# Patient Record
Sex: Female | Born: 1950 | Race: White | Hispanic: No | Marital: Married | Smoking: Never smoker
Health system: Southern US, Community
[De-identification: ages and names within clinical notes are randomized; demographics above are authoritative.]

## PROBLEM LIST (undated history)

## (undated) DIAGNOSIS — I10 Essential (primary) hypertension: Secondary | ICD-10-CM

## (undated) DIAGNOSIS — I7782 Antineutrophilic cytoplasmic antibody (ANCA) vasculitis: Secondary | ICD-10-CM

## (undated) DIAGNOSIS — Z8619 Personal history of other infectious and parasitic diseases: Secondary | ICD-10-CM

## (undated) DIAGNOSIS — I776 Arteritis, unspecified: Secondary | ICD-10-CM

## (undated) DIAGNOSIS — N250 Renal osteodystrophy: Secondary | ICD-10-CM

## (undated) DIAGNOSIS — N6002 Solitary cyst of left breast: Secondary | ICD-10-CM

## (undated) DIAGNOSIS — N189 Chronic kidney disease, unspecified: Secondary | ICD-10-CM

## (undated) DIAGNOSIS — E663 Overweight: Secondary | ICD-10-CM

## (undated) DIAGNOSIS — M85839 Other specified disorders of bone density and structure, unspecified forearm: Secondary | ICD-10-CM

## (undated) DIAGNOSIS — Z5189 Encounter for other specified aftercare: Secondary | ICD-10-CM

## (undated) DIAGNOSIS — D631 Anemia in chronic kidney disease: Secondary | ICD-10-CM

## (undated) DIAGNOSIS — E782 Mixed hyperlipidemia: Secondary | ICD-10-CM

## (undated) HISTORY — DX: Essential (primary) hypertension: I10

## (undated) HISTORY — PX: COLONOSCOPY: SHX174

## (undated) HISTORY — DX: Encounter for other specified aftercare: Z51.89

## (undated) HISTORY — DX: Anemia in chronic kidney disease: D63.1

## (undated) HISTORY — DX: Personal history of other infectious and parasitic diseases: Z86.19

## (undated) HISTORY — DX: Renal osteodystrophy: N25.0

## (undated) HISTORY — DX: Other specified disorders of bone density and structure, unspecified forearm: M85.839

## (undated) HISTORY — DX: Chronic kidney disease, unspecified: N18.9

## (undated) HISTORY — DX: Arteritis, unspecified: I77.6

## (undated) HISTORY — DX: Mixed hyperlipidemia: E78.2

## (undated) HISTORY — DX: Antineutrophilic cytoplasmic antibody (ANCA) vasculitis: I77.82

## (undated) HISTORY — DX: Overweight: E66.3

## (undated) HISTORY — PX: ABDOMINAL HYSTERECTOMY: SHX81

---

## 2014-06-06 DIAGNOSIS — O99119 Other diseases of the blood and blood-forming organs and certain disorders involving the immune mechanism complicating pregnancy, unspecified trimester: Secondary | ICD-10-CM | POA: Insufficient documentation

## 2014-06-06 DIAGNOSIS — N19 Unspecified kidney failure: Secondary | ICD-10-CM | POA: Insufficient documentation

## 2014-06-22 HISTORY — PX: RENAL BIOPSY: SHX156

## 2014-06-23 DIAGNOSIS — I776 Arteritis, unspecified: Secondary | ICD-10-CM | POA: Insufficient documentation

## 2014-06-23 DIAGNOSIS — N179 Acute kidney failure, unspecified: Secondary | ICD-10-CM | POA: Insufficient documentation

## 2017-01-24 ENCOUNTER — Ambulatory Visit: Payer: Self-pay | Admitting: Physician Assistant

## 2017-01-31 ENCOUNTER — Ambulatory Visit: Payer: Self-pay | Admitting: Physician Assistant

## 2017-04-05 ENCOUNTER — Encounter: Payer: Self-pay | Admitting: Physician Assistant

## 2017-04-05 ENCOUNTER — Ambulatory Visit (INDEPENDENT_AMBULATORY_CARE_PROVIDER_SITE_OTHER): Payer: BLUE CROSS/BLUE SHIELD | Admitting: Physician Assistant

## 2017-04-05 VITALS — BP 134/88 | HR 75 | Resp 14 | Ht 67.0 in | Wt 185.0 lb

## 2017-04-05 DIAGNOSIS — I776 Arteritis, unspecified: Secondary | ICD-10-CM

## 2017-04-05 DIAGNOSIS — I129 Hypertensive chronic kidney disease with stage 1 through stage 4 chronic kidney disease, or unspecified chronic kidney disease: Secondary | ICD-10-CM | POA: Diagnosis not present

## 2017-04-05 DIAGNOSIS — Z1231 Encounter for screening mammogram for malignant neoplasm of breast: Secondary | ICD-10-CM

## 2017-04-05 DIAGNOSIS — Z13220 Encounter for screening for lipoid disorders: Secondary | ICD-10-CM

## 2017-04-05 DIAGNOSIS — E663 Overweight: Secondary | ICD-10-CM

## 2017-04-05 DIAGNOSIS — N183 Chronic kidney disease, stage 3 unspecified: Secondary | ICD-10-CM | POA: Insufficient documentation

## 2017-04-05 DIAGNOSIS — Z1159 Encounter for screening for other viral diseases: Secondary | ICD-10-CM

## 2017-04-05 DIAGNOSIS — Z23 Encounter for immunization: Secondary | ICD-10-CM | POA: Diagnosis not present

## 2017-04-05 DIAGNOSIS — Z13820 Encounter for screening for osteoporosis: Secondary | ICD-10-CM

## 2017-04-05 DIAGNOSIS — N250 Renal osteodystrophy: Secondary | ICD-10-CM | POA: Diagnosis not present

## 2017-04-05 DIAGNOSIS — Z90710 Acquired absence of both cervix and uterus: Secondary | ICD-10-CM | POA: Insufficient documentation

## 2017-04-05 DIAGNOSIS — D631 Anemia in chronic kidney disease: Secondary | ICD-10-CM | POA: Diagnosis not present

## 2017-04-05 DIAGNOSIS — Z7689 Persons encountering health services in other specified circumstances: Secondary | ICD-10-CM

## 2017-04-05 DIAGNOSIS — I7782 Antineutrophilic cytoplasmic antibody (ANCA) vasculitis: Secondary | ICD-10-CM | POA: Insufficient documentation

## 2017-04-05 HISTORY — DX: Overweight: E66.3

## 2017-04-05 NOTE — Progress Notes (Signed)
HPI:                                                                Gloria Villa is a 67 y.o. 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 States she was on a transplant list, but has never had dialysis. Her renal function gradually recovered and she has been stable with CKD stage 3 and is no longer on a transplant list. She continues to follow with Dr. Danise Mina in [place], [place].  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 Compliant with medications. Checks BP's at home. BP range 94-113/68-75. Denies vision change, headache, chest pain with exertion, orthopnea, lightheadedness, syncope and edema.    Depression screen [facility] 2/9 04/05/2017  Decreased Interest 0  Down, Depressed, Hopeless 0  PHQ - 2 Score 0    No flowsheet data found.    Past Medical History:  Diagnosis Date  . ANCA-positive vasculitis (HCC)    MPO antibody  . Anemia due to chronic kidney disease   . Chronic kidney disease   . History of shingles    age 48's  . Hypertension   . Renal osteodystrophy    Calcitriol 0.5 mcg QOD   Past Surgical History:  Procedure Laterality Date  . ABDOMINAL HYSTERECTOMY     partial, fibroids  . RENAL BIOPSY  06/2014   Social History   Tobacco Use  . Smoking status: Never Smoker  . Smokeless tobacco: Never Used  Substance Use Topics  . Alcohol use: Yes    Alcohol/week: 0.6 oz    Types: 1 Standard drinks or equivalent per week    Frequency: Never   family history includes Diabetes in her brother and mother; Heart attack in her father; Hypertension in her brother and mother; Liver cancer in her mother; Stroke in her brother.    ROS: negative except as noted in the HPI  Medications: Current Outpatient Medications  Medication Sig Dispense Refill  . calcitRIOL (ROCALTROL) 0.5 MCG capsule Take 1 capsule by mouth  daily.    Marland Kitchen losartan (COZAAR) 25 MG tablet Take 12.5 mg by mouth daily.     No current facility-administered medications for this visit.    Allergies  Allergen Reactions  . Sulfa Antibiotics Rash       Objective:  BP 134/88   Pulse 75   Resp 14   Wt 185 lb (83.9 kg)  Gen:  alert, not ill-appearing, no distress, appropriate for age 64: head normocephalic without obvious abnormality, conjunctiva and cornea clear, trachea midline Pulm: Normal work of breathing, normal phonation, clear to auscultation bilaterally, no wheezes, rales or rhonchi CV: Normal rate, regular rhythm, s1 and s2 distinct, no murmurs, clicks or rubs  Neuro: alert and oriented x 3, no tremor MSK: extremities atraumatic, normal gait and station, trace peripheral edema Skin: intact, no rashes on exposed skin, no jaundice, no cyanosis Psych: well-groomed, cooperative, good eye contact, euthymic mood, affect mood-congruent, speech is articulate, and thought processes clear and goal-directed    No results found for this or any previous visit (from the past 72 hour(s)). No results found.    Assessment and Plan: 67 y.o.  female with   1. Encounter to establish care - reviewed PMH, PSH, PFH, medications and allergies - personally reviewed labs and recent office visit notes from [facility]. I agree to collaborate with Dr. Danise Mina in [place], who will continue managing CKD and vasculitis - reviewed health maintenance - due for mammogram and dexa - no longer candidate for Pap smear (hysterectomy benign disease) - colonoscopy UTD per patient, but may be due soon. Info given about Cologuard - immunizations UTD per patient - influenza vaccine high-dose given in office today  2. Breast cancer screening by mammogram - MM SCREENING BREAST TOMO BILATERAL; Future  3. Screening for osteoporosis - DG Bone Density; Future  4. Encounter for hepatitis C screening test for low risk patient - Hepatitis C  antibody  5. Screening for lipid disorders - Lipid Panel w/reflex Direct LDL  6. Chronic kidney disease (CKD), active medical management without dialysis, stage 3 (moderate) (HCC) - last Scr 1.59, BUN 27, GFR 33 12/27/16 - MPO 5.5 12/27/16 - per nephrology, due for repeat labs this month  7. Anemia due to stage 3 chronic kidney disease ([place]) - last Hgb 11.7 10/2016  8. Renal hypertension BP Readings from Last 3 Encounters:  04/05/17 134/88  - patient reports hx of white coat syndrome - home BP's are in range and soft - cont Losartan 12.5 mg  9. ANCA-positive vasculitis ([place])   10. Renal osteodystrophy - cont calcitriol per nephrology  11. Needs flu shot - Flu vaccine HIGH DOSE PF (Fluzone High dose)  Patient education and anticipatory guidance given Patient agrees with treatment plan Follow-up in 6 months or sooner as needed if symptoms worsen or fail to improve  Darlyne Russian PA-C

## 2017-04-05 NOTE — Patient Instructions (Signed)
Repeat labs in March. I have also ordered fasting lipid panel in addition to your Neprhologist's labs. The lab is a walk-in open M-F 7:30a-4:30p (closed 12:30-1:30p). Nothing to eat or drink after midnight or at least 8 hours before your blood draw. You can have water and your medications.     Preventive Care 27 Years and Older, Female Preventive care refers to lifestyle choices and visits with your health care provider that can promote health and wellness. What does preventive care include?  A yearly physical exam. This is also called an annual well check.  Dental exams once or twice a year.  Routine eye exams. Ask your health care provider how often you should have your eyes checked.  Personal lifestyle choices, including: ? Daily care of your teeth and gums. ? Regular physical activity. ? Eating a healthy diet. ? Avoiding tobacco and drug use. ? Limiting alcohol use. ? Practicing safe sex. ? Taking low-dose aspirin every day. ? Taking vitamin and mineral supplements as recommended by your health care provider. What happens during an annual well check? The services and screenings done by your health care provider during your annual well check will depend on your age, overall health, lifestyle risk factors, and family history of disease. Counseling Your health care provider may ask you questions about your:  Alcohol use.  Tobacco use.  Drug use.  Emotional well-being.  Home and relationship well-being.  Sexual activity.  Eating habits.  History of falls.  Memory and ability to understand (cognition).  Work and work Statistician.  Reproductive health.  Screening You may have the following tests or measurements:  Height, weight, and BMI.  Blood pressure.  Lipid and cholesterol levels. These may be checked every 5 years, or more frequently if you are over 51 years old.  Skin check.  Lung cancer screening. You may have this screening every year starting at age  7 if you have a 30-pack-year history of smoking and currently smoke or have quit within the past 15 years.  Fecal occult blood test (FOBT) of the stool. You may have this test every year starting at age 29.  Flexible sigmoidoscopy or colonoscopy. You may have a sigmoidoscopy every 5 years or a colonoscopy every 10 years starting at age 87.  Hepatitis C blood test.  Hepatitis B blood test.  Sexually transmitted disease (STD) testing.  Diabetes screening. This is done by checking your blood sugar (glucose) after you have not eaten for a while (fasting). You may have this done every 1-3 years.  Bone density scan. This is done to screen for osteoporosis. You may have this done starting at age 8.  Mammogram. This may be done every 1-2 years. Talk to your health care provider about how often you should have regular mammograms.  Talk with your health care provider about your test results, treatment options, and if necessary, the need for more tests. Vaccines Your health care provider may recommend certain vaccines, such as:  Influenza vaccine. This is recommended every year.  Tetanus, diphtheria, and acellular pertussis (Tdap, Td) vaccine. You may need a Td booster every 10 years.  Varicella vaccine. You may need this if you have not been vaccinated.  Zoster vaccine. You may need this after age 42.  Measles, mumps, and rubella (MMR) vaccine. You may need at least one dose of MMR if you were born in 1957 or later. You may also need a second dose.  Pneumococcal 13-valent conjugate (PCV13) vaccine. One dose is recommended after  age 82.  Pneumococcal polysaccharide (PPSV23) vaccine. One dose is recommended after age 48.  Meningococcal vaccine. You may need this if you have certain conditions.  Hepatitis A vaccine. You may need this if you have certain conditions or if you travel or work in places where you may be exposed to hepatitis A.  Hepatitis B vaccine. You may need this if you  have certain conditions or if you travel or work in places where you may be exposed to hepatitis B.  Haemophilus influenzae type b (Hib) vaccine. You may need this if you have certain conditions.  Talk to your health care provider about which screenings and vaccines you need and how often you need them. This information is not intended to replace advice given to you by your health care provider. Make sure you discuss any questions you have with your health care provider. Document Released: 03/06/2015 Document Revised: 10/28/2015 Document Reviewed: 12/09/2014 Elsevier Interactive Patient Education  [employer].

## 2017-04-19 ENCOUNTER — Ambulatory Visit: Payer: BLUE CROSS/BLUE SHIELD

## 2017-04-19 ENCOUNTER — Ambulatory Visit (INDEPENDENT_AMBULATORY_CARE_PROVIDER_SITE_OTHER): Payer: BLUE CROSS/BLUE SHIELD

## 2017-04-19 DIAGNOSIS — Z1231 Encounter for screening mammogram for malignant neoplasm of breast: Secondary | ICD-10-CM

## 2017-04-19 DIAGNOSIS — R928 Other abnormal and inconclusive findings on diagnostic imaging of breast: Secondary | ICD-10-CM | POA: Diagnosis not present

## 2017-04-20 ENCOUNTER — Other Ambulatory Visit: Payer: Self-pay | Admitting: Physician Assistant

## 2017-04-20 DIAGNOSIS — R928 Other abnormal and inconclusive findings on diagnostic imaging of breast: Secondary | ICD-10-CM

## 2017-04-24 ENCOUNTER — Encounter: Payer: Self-pay | Admitting: Physician Assistant

## 2017-04-24 ENCOUNTER — Ambulatory Visit
Admission: RE | Admit: 2017-04-24 | Discharge: 2017-04-24 | Disposition: A | Discharge status: Home / Self Care | Payer: BLUE CROSS/BLUE SHIELD | Source: Ambulatory Visit | Attending: Physician Assistant | Admitting: Physician Assistant

## 2017-04-24 ENCOUNTER — Other Ambulatory Visit: Payer: Self-pay | Admitting: Physician Assistant

## 2017-04-24 ENCOUNTER — Ambulatory Visit
Admission: RE | Admit: 2017-04-24 | Discharge: 2017-04-24 | Disposition: A | Discharge status: Home / Self Care | Payer: PRIVATE HEALTH INSURANCE | Source: Ambulatory Visit | Attending: Physician Assistant | Admitting: Physician Assistant

## 2017-04-24 DIAGNOSIS — N6320 Unspecified lump in the left breast, unspecified quadrant: Secondary | ICD-10-CM

## 2017-04-24 DIAGNOSIS — R928 Other abnormal and inconclusive findings on diagnostic imaging of breast: Secondary | ICD-10-CM | POA: Insufficient documentation

## 2017-04-26 ENCOUNTER — Encounter: Payer: Self-pay | Admitting: Physician Assistant

## 2017-04-26 ENCOUNTER — Ambulatory Visit (INDEPENDENT_AMBULATORY_CARE_PROVIDER_SITE_OTHER): Payer: BLUE CROSS/BLUE SHIELD

## 2017-04-26 DIAGNOSIS — M85832 Other specified disorders of bone density and structure, left forearm: Secondary | ICD-10-CM | POA: Diagnosis not present

## 2017-04-26 DIAGNOSIS — M85839 Other specified disorders of bone density and structure, unspecified forearm: Secondary | ICD-10-CM

## 2017-04-26 HISTORY — DX: Other specified disorders of bone density and structure, unspecified forearm: M85.839

## 2017-04-26 NOTE — Progress Notes (Signed)
Bone density shows osteopenia, which is decreased bone mass Recommend she continue her calcitriol and add vitamin D 1000 units daily Also recommend regular weight bearing exercise to prevent osteoporosis Repeat bone density in 2 years

## 2017-04-27 ENCOUNTER — Encounter: Payer: Self-pay | Admitting: Physician Assistant

## 2017-04-27 DIAGNOSIS — E782 Mixed hyperlipidemia: Secondary | ICD-10-CM

## 2017-04-27 HISTORY — DX: Mixed hyperlipidemia: E78.2

## 2017-04-27 LAB — LIPID PANEL W/REFLEX DIRECT LDL
CHOLESTEROL: 245 mg/dL — AB (ref <200)
HDL: 54 mg/dL (ref >50)
LDL CHOLESTEROL (CALC): 161 mg/dL — AB
Non-HDL Cholesterol (Calc): 191 mg/dL (calc) — ABNORMAL HIGH (ref <130)
Total CHOL/HDL Ratio: 4.5 (calc) (ref <5.0)
Triglycerides: 155 mg/dL — ABNORMAL HIGH (ref <150)

## 2017-04-27 LAB — HEPATITIS C ANTIBODY
Hepatitis C Ab: NONREACTIVE
SIGNAL TO CUT-OFF: 0.03 (ref <1.00)

## 2017-04-27 NOTE — Progress Notes (Signed)
Total and LDL cholesterol are high LDL is 161 and I would like her below 100 I would recommend cholesterol lowering medication in addition to low-fat diet and aerobic exericse

## 2017-10-30 ENCOUNTER — Encounter: Payer: Self-pay | Admitting: Physician Assistant

## 2017-10-30 ENCOUNTER — Ambulatory Visit
Admission: RE | Admit: 2017-10-30 | Discharge: 2017-10-30 | Disposition: A | Discharge status: Home / Self Care | Payer: PRIVATE HEALTH INSURANCE | Source: Ambulatory Visit | Attending: Physician Assistant | Admitting: Physician Assistant

## 2017-10-30 ENCOUNTER — Ambulatory Visit
Admission: RE | Admit: 2017-10-30 | Discharge: 2017-10-30 | Disposition: A | Discharge status: Home / Self Care | Payer: BLUE CROSS/BLUE SHIELD | Source: Ambulatory Visit | Attending: Physician Assistant | Admitting: Physician Assistant

## 2017-10-30 ENCOUNTER — Other Ambulatory Visit: Payer: Self-pay | Admitting: Physician Assistant

## 2017-10-30 DIAGNOSIS — N6002 Solitary cyst of left breast: Secondary | ICD-10-CM | POA: Insufficient documentation

## 2017-10-30 DIAGNOSIS — N6320 Unspecified lump in the left breast, unspecified quadrant: Secondary | ICD-10-CM

## 2017-10-30 HISTORY — DX: Solitary cyst of left breast: N60.02

## 2018-05-01 ENCOUNTER — Ambulatory Visit
Admission: RE | Admit: 2018-05-01 | Discharge: 2018-05-01 | Disposition: A | Discharge status: Home / Self Care | Payer: BLUE CROSS/BLUE SHIELD | Source: Ambulatory Visit | Attending: Physician Assistant | Admitting: Physician Assistant

## 2018-05-01 DIAGNOSIS — N6320 Unspecified lump in the left breast, unspecified quadrant: Secondary | ICD-10-CM

## 2019-01-01 ENCOUNTER — Emergency Department
Admission: EM | Admit: 2019-01-01 | Discharge: 2019-01-01 | Disposition: A | Discharge status: Home / Self Care | Payer: BLUE CROSS/BLUE SHIELD | Source: Home / Self Care | Attending: Emergency Medicine | Admitting: Emergency Medicine

## 2019-01-01 ENCOUNTER — Other Ambulatory Visit: Payer: Self-pay

## 2019-01-01 DIAGNOSIS — R05 Cough: Secondary | ICD-10-CM | POA: Diagnosis not present

## 2019-01-01 DIAGNOSIS — U071 COVID-19: Secondary | ICD-10-CM

## 2019-01-01 DIAGNOSIS — R519 Headache, unspecified: Secondary | ICD-10-CM

## 2019-01-01 DIAGNOSIS — Z20828 Contact with and (suspected) exposure to other viral communicable diseases: Secondary | ICD-10-CM

## 2019-01-01 DIAGNOSIS — R059 Cough, unspecified: Secondary | ICD-10-CM

## 2019-01-01 DIAGNOSIS — Z20822 Contact with and (suspected) exposure to covid-19: Secondary | ICD-10-CM

## 2019-01-01 LAB — POC SARS CORONAVIRUS 2 AG -  ED: SARS Coronavirus 2 Ag: POSITIVE

## 2019-01-01 NOTE — ED Triage Notes (Signed)
Pt has had a low grade headache and cough since Wednesday or Thursday last week.  Husband tested positive for COVID on Nov 5.

## 2019-01-01 NOTE — Discharge Instructions (Addendum)
Your test is positive for COVID-19.  You have signs and symptoms of COVID-19, but they are mild.  Please read attached instruction sheets on Covid.  Overall, treatment is rest, push fluids, may use Tylenol for pain or fever.  With your history of kidney vasculitis in the past, avoid all NSAIDs.  If any severe worsening symptoms, go to emergency room.  You need to establish with a local PCP. You need to quarantine, as described in instruction sheets.  You would need to wait at least 10 days after fever is gone and her symptoms are better, before ending quarantine.

## 2019-01-02 NOTE — ED Provider Notes (Signed)
[facility]    CSN: [id number] Arrival date & time: 01/01/19  1242      History   Chief Complaint Chief Complaint  Patient presents with   Cough   Headache    HPI Gloria Villa is a 68 y.o. female.    Cough Associated symptoms: headaches   Headache Associated symptoms: cough    Low-grade headache and mild cough, nonproductive.  Onset 5 days ago.  Minimal fatigue, but otherwise feeling well.  Husband tested positive for Covid November 5.Not treating with any medicine.  She has been drinking plenty of fluids.  Appetite okay.  No nausea or vomiting or diarrhea.  No chest pain or shortness of breath.  Slightly decreased sense of smell.  No focal neurologic symptoms.  She has a history of chronic kidney disease that has been stable and she follows up with her specialist regularly.  Urine output normal for her these days.   Review of Systems  +  mild Cough No  shortness of breath No  respiratory distress  + mild fatigue + minimal nasal congestion No mild sore throat No mild swollen anterior neck glands No  vomiting No  diarrhea No  acute vision changes No  stiff neck No  focal weakness No  syncope No  seizures No  GU symptoms No  new rash   Pertinent items noted in HPI and remainder of comprehensive ROS otherwise negative.  Past Medical History:  Diagnosis Date   ANCA-positive vasculitis (HCC)    MPO antibody   Anemia due to chronic kidney disease    Chronic kidney disease    Cyst of left breast 10/30/2017   stable cyst with internal septation in the left breast at 10 o'clock 5 cm from the nipple measuring 6 x 2 x 8 mm   History of shingles    age 34's   Hypertension    Mixed hyperlipidemia 04/27/2017   Osteopenia of forearm 04/26/2017   Overweight with body mass index (BMI) 25.0-29.9 04/05/2017   Renal osteodystrophy    Calcitriol 0.5 mcg QOD    Patient Active Problem List   Diagnosis Date Noted   Cyst of left breast 10/30/2017    Mixed hyperlipidemia 04/27/2017   Osteopenia of forearm 04/26/2017   Abnormal mammogram of left breast 04/24/2017   Chronic kidney disease (CKD), active medical management without dialysis, stage 3 (moderate) 04/05/2017   Anemia due to stage 3 chronic kidney disease 04/05/2017   Renal hypertension 04/05/2017   ANCA-positive vasculitis ([place]) 04/05/2017   H/O hysterectomy for benign disease 04/05/2017   Overweight with body mass index (BMI) 25.0-29.9 04/05/2017   Renal osteodystrophy     Past Surgical History:  Procedure Laterality Date   ABDOMINAL HYSTERECTOMY     partial, fibroids   RENAL BIOPSY  06/2014    OB History    Gravida  2   Para  2   Term      Preterm      AB      Living  2     SAB      TAB      Ectopic      Multiple      Live Births               Home Medications    Prior to Admission medications   Medication Sig Start Date End Date Taking? Authorizing Provider  calcitRIOL (ROCALTROL) 0.5 MCG capsule Take 1 capsule by mouth daily. 01/17/17   [provider]  losartan (COZAAR) 25 MG tablet Take 12.5 mg by mouth daily. 01/16/17   [provider]    Family History Family History  Problem Relation Age of Onset   Liver cancer Mother    Diabetes Mother    Hypertension Mother    Heart attack Father    Diabetes Brother    Hypertension Brother    Stroke Brother    Breast cancer Neg Hx     Social History Social History   Tobacco Use   Smoking status: Never Smoker   Smokeless tobacco: Never Used  Substance Use Topics   Alcohol use: Yes    Alcohol/week: 1.0 standard drinks    Types: 1 Standard drinks or equivalent per week    Frequency: Never   Drug use: No     Allergies   Sulfa antibiotics   Review of Systems Review of Systems  Respiratory: Positive for cough.   Neurological: Positive for headaches.  All other systems reviewed and are negative.  Pertinent items noted in HPI and  remainder of comprehensive ROS otherwise negative.   Physical Exam Triage Vital Signs ED Triage Vitals  Enc Vitals Group     BP 01/01/19 1301 (!) 147/93     Pulse Rate 01/01/19 1301 92     Resp 01/01/19 1301 20     Temp 01/01/19 1301 98.5 F (36.9 C)     Temp Source 01/01/19 1301 Oral     SpO2 01/01/19 1301 97 %     Weight 01/01/19 1303 182 lb (82.6 kg)     Height 01/01/19 1303 5\' 7"  (1.702 m)     Head Circumference --      Peak Flow --      Pain Score 01/01/19 1303 2     Pain Loc --      Pain Edu? --      Excl. in [place]? --    No data found.  Updated Vital Signs BP (!) 147/93 (BP Location: Right Arm)    Pulse 92    Temp 98.5 F (36.9 C) (Oral)    Resp 20    Ht 5\' 7"  (1.702 m)    Wt 82.6 kg    SpO2 97%    BMI 28.51 kg/m   Physical Exam Vitals signs and nursing note reviewed.  Constitutional:      General: She is not in acute distress.    Appearance: She is well-developed. She is ill-appearing (fatigued, but no cardiorespiratory distress). She is not toxic-appearing or diaphoretic.  HENT:     Head: Normocephalic and atraumatic.     Right Ear: Tympanic membrane and external ear normal.     Left Ear: Tympanic membrane and external ear normal.     Nose: No rhinorrhea.     Mouth/Throat:     Pharynx: No pharyngeal swelling or oropharyngeal exudate.No erythema Eyes:     General: No scleral icterus.       Right eye: No discharge.        Left eye: No discharge.     Conjunctiva/sclera: Conjunctivae normal.  Neck:     Musculoskeletal: Neck supple.  Cardiovascular:     Rate and Rhythm: Normal rate and regular rhythm.     Heart sounds: Normal heart sounds.  Pulmonary:     Effort: No respiratory distress.     Breath sounds: Normal breath sounds. No stridor. No wheezing or rales.  Abdominal:     Palpations: Abdomen is soft.     Tenderness: There  is no abdominal tenderness.  Lymphadenopathy:     Cervical: No cervical adenopathy  Skin:    General: Skin is warm.     Findings:  No rash.  Neurological:     Mental Status: She is alert.     UC Treatments / Results  Labs (all labs ordered are listed, but only abnormal results are displayed) Labs Reviewed  POC SARS CORONAVIRUS 2 ED   Rapid Covid test today positive here in urgent care.     Radiology No results found.  Procedures Procedures (including critical care time)  Medications Ordered in UC Medications - No data to display  Initial Impression / Assessment and Plan / UC Course  I have reviewed the triage vital signs and the nursing notes.  Pertinent labs & imaging results that were available during my care of the patient were reviewed by me and considered in my medical decision making (see chart for details).     Rapid Covid test today positive here in urgent care.    She has mild case of Covid without any severe symptoms and she is tolerating p.o.'s well.  We discussed home care.  Red flags discussed.  Discussed what to do if any red flags.  AVS printed and given to patient.  She voiced understanding and agreement with plans Final Clinical Impressions(s) / UC Diagnoses   Final diagnoses:  Nonintractable headache, unspecified chronicity pattern, unspecified headache type  Exposure to COVID-19 virus  Cough  COVID-19 virus infection     Discharge Instructions     Your test is positive for COVID-19.  You have signs and symptoms of COVID-19, but they are mild.  Please read attached instruction sheets on Covid.  Overall, treatment is rest, push fluids, may use Tylenol for pain or fever.  With your history of kidney vasculitis in the past, avoid all NSAIDs.  If any severe worsening symptoms, go to emergency room.  You need to establish with a local PCP. You need to quarantine, as described in instruction sheets.  You would need to wait at least 10 days after fever is gone and her symptoms are better, before ending quarantine.      Jacqulyn Cane, MD 01/02/19 [phone]

## 2019-03-22 ENCOUNTER — Other Ambulatory Visit: Payer: Self-pay | Admitting: Physician Assistant

## 2019-03-22 DIAGNOSIS — N6320 Unspecified lump in the left breast, unspecified quadrant: Secondary | ICD-10-CM

## 2019-05-07 ENCOUNTER — Other Ambulatory Visit: Payer: BLUE CROSS/BLUE SHIELD

## 2019-05-20 ENCOUNTER — Other Ambulatory Visit: Payer: Self-pay

## 2019-05-20 ENCOUNTER — Encounter: Payer: Self-pay | Admitting: Medical-Surgical

## 2019-05-20 ENCOUNTER — Other Ambulatory Visit: Payer: Self-pay | Admitting: Medical-Surgical

## 2019-05-20 ENCOUNTER — Ambulatory Visit (INDEPENDENT_AMBULATORY_CARE_PROVIDER_SITE_OTHER): Payer: BC Managed Care – PPO | Admitting: Medical-Surgical

## 2019-05-20 VITALS — BP 165/97 | HR 71 | Temp 97.9°F | Ht 65.75 in | Wt 172.5 lb

## 2019-05-20 DIAGNOSIS — Z0000 Encounter for general adult medical examination without abnormal findings: Secondary | ICD-10-CM

## 2019-05-20 DIAGNOSIS — Z1231 Encounter for screening mammogram for malignant neoplasm of breast: Secondary | ICD-10-CM

## 2019-05-20 DIAGNOSIS — E782 Mixed hyperlipidemia: Secondary | ICD-10-CM

## 2019-05-20 DIAGNOSIS — Z1211 Encounter for screening for malignant neoplasm of colon: Secondary | ICD-10-CM

## 2019-05-20 NOTE — Addendum Note (Signed)
Addended by: Towana Badger on: 05/20/2019 04:40 PM   Modules accepted: Orders

## 2019-05-20 NOTE — Progress Notes (Signed)
Order corrected for provider.

## 2019-05-20 NOTE — Progress Notes (Signed)
HPI: Gloria Villa is a 69 y.o. 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  she presents to [facility] today, 05/20/19,  for chief complaint of: Annual physical exam  Exercise- walks 10k steps per day most days Diet- limits sodium,  Likes vegetables, actively trying to lose weight, lost 12 lbs since January, goal is 164, feels better there Eye- just got new glasses, appointment on Wednesday Dentist- every 6 months for cleaning Pap- hysterectomy, ovaries remain  Chronic kidney disease managed by nephrologist located in [place] state.  Patient reports recent labs in January, will ask practice to send them to [place] for our records.  Elevated blood pressure in office today.  Patient reports this happens all the time when she goes to the doctor.  Reports checking blood pressure at home with an automatic blood pressure cuff, readings 120s over 70s, sometimes low at 90s over 60s.  Checks blood pressure most days of the week.  Symptomatic with lower blood pressures feels "spacey" and develops a dull headache.  When this occurs, she drinks Gatorade and rest with her feet elevated until it resolves.  Taking losartan 12.5 mg daily.  Denies chest pain, shortness of breath, and palpitations.  Endorses occasional lower extremity edema depending on sodium intake.  Past medical, surgical, social and family history reviewed:  Patient Active Problem List   Diagnosis Date Noted  . Cyst of left breast 10/30/2017  . Mixed hyperlipidemia 04/27/2017  . Osteopenia of forearm 04/26/2017  . Abnormal mammogram of left breast 04/24/2017  . Chronic kidney disease (CKD), active  medical management without dialysis, stage 3 (moderate) 04/05/2017  . Anemia due to stage 3 chronic kidney disease 04/05/2017  . Renal hypertension 04/05/2017  . 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 H/O hysterectomy for benign disease 04/05/2017  . Overweight with body mass index (BMI) 25.0-29.9 04/05/2017  . Renal osteodystrophy     Past Surgical History:  Procedure Laterality Date  . ABDOMINAL HYSTERECTOMY     partial, fibroids  . RENAL BIOPSY  06/2014    Social History   Tobacco Use  . Smoking status: Never Smoker  . Smokeless tobacco: Never Used  Substance Use Topics  . Alcohol use: Yes    Comment: 1-2 drinks/month, wine    Family History  Problem Relation Age of Onset  . Liver cancer Mother   . Diabetes Mother   . Hypertension Mother   . Heart attack Father   . Diabetes Brother   . Hypertension Brother   . Stroke Brother   . Breast cancer Neg Hx      Current medication list and allergy/intolerance information reviewed:    Current Outpatient Medications  Medication Sig Dispense Refill  . Cholecalciferol (VITAMIN D3) 50 MCG (2000 UT) capsule Take 1 capsule by mouth daily.    Marland Kitchen losartan (COZAAR) 25 MG tablet Take 12.5 mg by mouth daily.     No current facility-administered medications for this visit.    Allergies  Allergen Reactions  . Sulfa Antibiotics Rash      Review of Systems:  Constitutional:  No  fever, no chills, No recent illness, No unintentional weight changes. No significant fatigue.   HEENT: No  headache, no vision change, no hearing change, No sore  throat, No  sinus pressure  Cardiac: No  chest pain, No  pressure, No palpitations, No  Orthopnea  Respiratory:  No  shortness of breath. No  Cough  Gastrointestinal: No  abdominal pain, No  nausea, No  vomiting,  No  blood in stool, No  diarrhea, No  constipation   Musculoskeletal: No new myalgia/arthralgia  Skin: No  Rash, No other wounds/concerning lesions  Genitourinary: No   incontinence, No  abnormal genital bleeding, No abnormal genital discharge  Hem/Onc: No  easy bruising/bleeding, No  abnormal lymph node  Endocrine: No cold intolerance,  No heat intolerance. No polyuria/polydipsia/polyphagia   Neurologic: No  weakness, No  dizziness, No  slurred speech/focal weakness/facial droop  Psychiatric: No  concerns with depression, No  concerns with anxiety, No sleep problems, No mood problems  Exam:  BP (!) 165/97   Pulse 71   Temp 97.9 F (36.6 C) (Oral)   Ht 5' 5.75" (1.67 m)   Wt 172 lb 8 oz (78.2 kg)   SpO2 100%   BMI 28.05 kg/m   Constitutional: VS see above. General Appearance: alert, well-developed, well-nourished, NAD  Eyes: Normal lids and conjunctive, non-icteric sclera  Ears, Nose, Mouth, Throat: MMM, Normal external inspection ears/nares/mouth/lips/gums. TM normal bilaterally. Pharynx/tonsils no erythema, no exudate. Nasal mucosa normal.   Neck: No masses, trachea midline. No thyroid enlargement. No tenderness/mass appreciated. No lymphadenopathy  Respiratory: Normal respiratory effort. no wheeze, no rhonchi, no rales  Cardiovascular: S1/S2 normal, no murmur, no rub/gallop auscultated. RRR. + Nonpitting lower extremity edema. Pedal pulse II/IV bilaterally DP and PT. No carotid bruit or JVD. No abdominal aortic bruit.  Gastrointestinal: Nontender, no masses. No hepatomegaly, no splenomegaly. No hernia appreciated. Bowel sounds normal. Rectal exam deferred.   Musculoskeletal: Gait normal. No clubbing/cyanosis of digits.   Neurological: Normal balance/coordination. No tremor. No cranial nerve deficit on limited exam. Motor and sensation intact and symmetric. Cerebellar reflexes intact.   Skin: warm, dry, intact. No rash/ulcer. No concerning nevi or subq nodules on limited exam.    Psychiatric: Normal judgment/insight. Normal mood and affect. Oriented x3.    No results found for this or any previous visit (from the past 72 hour(s)).  No  results found.   ASSESSMENT/PLAN:   1. Annual physical exam Checking CBC, CMP, and lipid panel.  Up-to-date on vaccinations and most preventative care. - CBC - COMPLETE METABOLIC PANEL WITH GFR - Lipid panel  2. Colon cancer screening Referral to GI for colonoscopy. - Ambulatory referral to Gastroenterology  3. Encounter for screening mammogram for malignant neoplasm of breast Mammogram ordered. - MM 3D SCREEN BREAST BILATERAL   Orders Placed This Encounter  Procedures  . MM 3D SCREEN BREAST BILATERAL  . CBC  . COMPLETE METABOLIC PANEL WITH GFR  . Lipid panel  . Ambulatory referral to Gastroenterology    No orders of the defined types were placed in this encounter.   There are no Patient Instructions on file for this visit.  Follow-up plan: Return in about 2 weeks (around 06/03/2019) for nurse visit for BP check, bring home machine.

## 2019-05-20 NOTE — Addendum Note (Signed)
Addended bySamuel Bouche on: 05/20/2019 03:02 PM   Modules accepted: Orders

## 2019-05-24 ENCOUNTER — Other Ambulatory Visit: Payer: Self-pay | Admitting: Medical-Surgical

## 2019-05-24 DIAGNOSIS — Z1231 Encounter for screening mammogram for malignant neoplasm of breast: Secondary | ICD-10-CM

## 2019-05-28 ENCOUNTER — Other Ambulatory Visit: Payer: Self-pay | Admitting: Medical-Surgical

## 2019-05-28 DIAGNOSIS — N6320 Unspecified lump in the left breast, unspecified quadrant: Secondary | ICD-10-CM

## 2019-05-30 ENCOUNTER — Encounter: Payer: Self-pay | Admitting: Gastroenterology

## 2019-05-31 ENCOUNTER — Ambulatory Visit: Payer: BC Managed Care – PPO

## 2019-05-31 ENCOUNTER — Other Ambulatory Visit: Payer: Self-pay

## 2019-05-31 ENCOUNTER — Ambulatory Visit (INDEPENDENT_AMBULATORY_CARE_PROVIDER_SITE_OTHER): Payer: BC Managed Care – PPO | Admitting: Medical-Surgical

## 2019-05-31 VITALS — BP 140/76 | HR 95 | Ht 64.0 in | Wt 172.0 lb

## 2019-05-31 DIAGNOSIS — R030 Elevated blood-pressure reading, without diagnosis of hypertension: Secondary | ICD-10-CM | POA: Diagnosis not present

## 2019-05-31 NOTE — Progress Notes (Signed)
Patient presents to clinic for a blood pressure check and she brought her home monitor as well.   I checked her BP on our machine and it was 140/76 and heart rate was 95. Her home machine was 142/87 and heart rate was 99.   I had her wait a minute and talked with PCP. She re-checked her blood pressure after sitting for a few minutes and it was 130/84 and 99 heart rate.   She is going to call back and make a follow up in 6 months with PCP. She was advised to keep up the same routine. She will call back and make a 6 month follow up. No other concerns.

## 2019-06-01 LAB — CBC
HCT: 38 % (ref 35.0–45.0)
Hemoglobin: 12.7 g/dL (ref 11.7–15.5)
MCH: 32.2 pg (ref 27.0–33.0)
MCHC: 33.4 g/dL (ref 32.0–36.0)
MCV: 96.4 fL (ref 80.0–100.0)
MPV: 9.3 fL (ref 7.5–12.5)
Platelets: 289 10*3/uL (ref 140–400)
RBC: 3.94 10*6/uL (ref 3.80–5.10)
RDW: 12.9 % (ref 11.0–15.0)
WBC: 5.2 10*3/uL (ref 3.8–10.8)

## 2019-06-01 LAB — LIPID PANEL
Cholesterol: 247 mg/dL — ABNORMAL HIGH (ref <200)
HDL: 63 mg/dL (ref >=50)
LDL Cholesterol (Calc): 167 mg/dL (calc) — ABNORMAL HIGH
Non-HDL Cholesterol (Calc): 184 mg/dL (calc) — ABNORMAL HIGH (ref <130)
Total CHOL/HDL Ratio: 3.9 (calc) (ref <5.0)
Triglycerides: 73 mg/dL (ref <150)

## 2019-06-01 LAB — COMPLETE METABOLIC PANEL WITH GFR
AG Ratio: 1.5 (calc) (ref 1.0–2.5)
ALT: 11 U/L (ref 6–29)
AST: 18 U/L (ref 10–35)
Albumin: 4.4 g/dL (ref 3.6–5.1)
Alkaline phosphatase (APISO): 86 U/L (ref 37–153)
BUN/Creatinine Ratio: 18 (calc) (ref 6–22)
BUN: 25 mg/dL (ref 7–25)
CO2: 23 mmol/L (ref 20–32)
Calcium: 9.8 mg/dL (ref 8.6–10.4)
Chloride: 104 mmol/L (ref 98–110)
Creat: 1.36 mg/dL — ABNORMAL HIGH (ref 0.50–0.99)
GFR, Est African American: 46 mL/min/{1.73_m2} — ABNORMAL LOW (ref >=60)
GFR, Est Non African American: 40 mL/min/{1.73_m2} — ABNORMAL LOW (ref >=60)
Globulin: 3 g/dL (calc) (ref 1.9–3.7)
Glucose, Bld: 78 mg/dL (ref 65–99)
Potassium: 5.1 mmol/L (ref 3.5–5.3)
Sodium: 138 mmol/L (ref 135–146)
Total Bilirubin: 0.5 mg/dL (ref 0.2–1.2)
Total Protein: 7.4 g/dL (ref 6.1–8.1)

## 2019-06-03 MED ORDER — ATORVASTATIN CALCIUM 20 MG PO TABS: 20.0000 mg | ORAL_TABLET | Freq: Every day | ORAL | 3 refills | Status: DC

## 2019-06-03 NOTE — Addendum Note (Signed)
Addended bySamuel Bouche on: 06/03/2019 08:10 AM   Modules accepted: Orders

## 2019-06-10 ENCOUNTER — Telehealth: Payer: Self-pay

## 2019-06-10 NOTE — Telephone Encounter (Signed)
FYI: Pt called regarding recent lipid panel results. Pt states that she does not want to start atorvastatin 20 mg at this time. Pt states she will work on diet, exercise, and weight loss and then recheck lipids in 6 weeks. At that time, if things have improved she will continue with diet, exercise, and weight loss. If it has not improved in 6 weeks, at that time she will start the atorvastatin 20 mg.

## 2019-06-12 ENCOUNTER — Ambulatory Visit
Admission: RE | Admit: 2019-06-12 | Discharge: 2019-06-12 | Disposition: A | Discharge status: Home / Self Care | Payer: BC Managed Care – PPO | Source: Ambulatory Visit | Attending: Medical-Surgical | Admitting: Medical-Surgical

## 2019-06-12 ENCOUNTER — Other Ambulatory Visit: Payer: Self-pay

## 2019-06-12 DIAGNOSIS — N6320 Unspecified lump in the left breast, unspecified quadrant: Secondary | ICD-10-CM

## 2019-06-25 ENCOUNTER — Other Ambulatory Visit: Payer: Self-pay

## 2019-06-25 ENCOUNTER — Ambulatory Visit (AMBULATORY_SURGERY_CENTER): Payer: Self-pay | Admitting: *Deleted

## 2019-06-25 VITALS — Temp 97.1°F | Ht 65.75 in | Wt 165.2 lb

## 2019-06-25 DIAGNOSIS — Z01818 Encounter for other preprocedural examination: Secondary | ICD-10-CM

## 2019-06-25 DIAGNOSIS — Z1211 Encounter for screening for malignant neoplasm of colon: Secondary | ICD-10-CM

## 2019-06-25 MED ORDER — SUTAB 1479-225-188 MG PO TABS: 1.0000 | ORAL_TABLET | Freq: Once | ORAL | 0 refills | Status: AC

## 2019-06-25 NOTE — Progress Notes (Signed)
covid test 07-05-19 at 12:10 pm  Pt is aware that care partner will wait in the car during procedure; if they feel like they will be too hot or cold to wait in the car; they may wait in the 4 th floor lobby. Patient is aware to bring only one care partner. We want them to wear a mask (we do not have any that we can provide them), practice social distancing, and we will check their temperatures when they get here.  I did remind the patient that their care partner needs to stay in the parking lot the entire time and have a cell phone available, we will call them when the pt is ready for discharge. Patient will wear mask into building.   No egg or soy allergy  No home oxygen use   No medications for weight loss taken  emmi information given  Pt denies constipation issues   No trouble with anesthesia, difficulty with intubation or hx/fam hx of malignant hyperthermia per pt   Sutab code put into RX and paper copy given to pt to show pharmacy

## 2019-07-04 ENCOUNTER — Encounter: Payer: Self-pay | Admitting: Gastroenterology

## 2019-07-05 ENCOUNTER — Ambulatory Visit (INDEPENDENT_AMBULATORY_CARE_PROVIDER_SITE_OTHER): Payer: BC Managed Care – PPO

## 2019-07-05 ENCOUNTER — Other Ambulatory Visit: Payer: Self-pay | Admitting: Gastroenterology

## 2019-07-05 ENCOUNTER — Other Ambulatory Visit: Payer: Self-pay

## 2019-07-05 DIAGNOSIS — Z1159 Encounter for screening for other viral diseases: Secondary | ICD-10-CM

## 2019-07-05 LAB — SARS CORONAVIRUS 2 (TAT 6-24 HRS): SARS Coronavirus 2: NEGATIVE

## 2019-07-09 ENCOUNTER — Encounter: Payer: Self-pay | Admitting: Gastroenterology

## 2019-07-09 ENCOUNTER — Other Ambulatory Visit: Payer: Self-pay

## 2019-07-09 ENCOUNTER — Ambulatory Visit (AMBULATORY_SURGERY_CENTER): Payer: BC Managed Care – PPO | Admitting: Gastroenterology

## 2019-07-09 VITALS — BP 120/73 | HR 67 | Temp 96.6°F | Resp 11 | Ht 65.0 in | Wt 165.0 lb

## 2019-07-09 DIAGNOSIS — Z1211 Encounter for screening for malignant neoplasm of colon: Secondary | ICD-10-CM

## 2019-07-09 DIAGNOSIS — K621 Rectal polyp: Secondary | ICD-10-CM

## 2019-07-09 DIAGNOSIS — D128 Benign neoplasm of rectum: Secondary | ICD-10-CM

## 2019-07-09 DIAGNOSIS — D122 Benign neoplasm of ascending colon: Secondary | ICD-10-CM | POA: Diagnosis not present

## 2019-07-09 MED ORDER — SODIUM CHLORIDE 0.9 % IV SOLN: 500.0000 mL | Freq: Once | INTRAVENOUS | Status: DC

## 2019-07-09 NOTE — Progress Notes (Signed)
A/ox3, pleased with MAC, report to RN 

## 2019-07-09 NOTE — Patient Instructions (Signed)
YOU HAD AN ENDOSCOPIC PROCEDURE TODAY AT [facility]:   Refer to the procedure report that was given to you for any specific questions about what was found during the examination.  If the procedure report does not answer your questions, please call your gastroenterologist to clarify.  If you requested that your care partner not be given the details of your procedure findings, then the procedure report has been included in a sealed envelope for you to review at your convenience later.  YOU SHOULD EXPECT: Some feelings of bloating in the abdomen. Passage of more gas than usual.  Walking can help get rid of the air that was put into your GI tract during the procedure and reduce the bloating. If you had a lower endoscopy (such as a colonoscopy or flexible sigmoidoscopy) you may notice spotting of blood in your stool or on the toilet paper. If you underwent a bowel prep for your procedure, you may not have a normal bowel movement for a few days.  Please Note:  You might notice some irritation and congestion in your nose or some drainage.  This is from the oxygen used during your procedure.  There is no need for concern and it should clear up in a day or so.  SYMPTOMS TO REPORT IMMEDIATELY:   Following lower endoscopy (colonoscopy or flexible sigmoidoscopy):  Excessive amounts of blood in the stool  Significant tenderness or worsening of abdominal pains  Swelling of the abdomen that is new, acute  Fever of 100F or higher  For urgent or emergent issues, a gastroenterologist can be reached at any hour by calling [phone]. Do not use MyChart messaging for urgent concerns.    DIET:  We do recommend a small meal at first, but then you may proceed to your regular diet.  Drink plenty of fluids but you should avoid alcoholic beverages for 24 hours.  ACTIVITY:  You should plan to take it easy for the rest of today and you should NOT DRIVE or use heavy machinery until tomorrow (because  of the sedation medicines used during the test).    FOLLOW UP: Our staff will call the number listed on your records 48-72 hours following your procedure to check on you and address any questions or concerns that you may have regarding the information given to you following your procedure. If we do not reach you, we will leave a message.  We will attempt to reach you two times.  During this call, we will ask if you have developed any symptoms of COVID 19. If you develop any symptoms (ie: fever, flu-like symptoms, shortness of breath, cough etc.) before then, please call [phone].  If you test positive for Covid 19 in the 2 weeks post procedure, please call and report this information to [place].    If any biopsies were taken you will be contacted by phone or by letter within the next 1-3 weeks.  Please call us at [phone] if you have not heard about the biopsies in 3 weeks.   SIGNATURES/CONFIDENTIALITY: You and/or your care partner have signed paperwork which will be entered into your electronic medical record.  These signatures attest to the fact that that the information above on your After Visit Summary has been reviewed and is understood.  Full responsibility of the confidentiality of this discharge information lies with you and/or your care-partner.  Await pathology  Please continue your normal medications  Please read over handout about polyps, high fiber diets and  hemorrhoids

## 2019-07-09 NOTE — Progress Notes (Signed)
Pt's states no medical or surgical changes since previsit or office visit. 

## 2019-07-09 NOTE — Progress Notes (Signed)
Called to room to assist during endoscopic procedure.  Patient ID and intended procedure confirmed with present staff. Received instructions for my participation in the procedure from the performing physician.  

## 2019-07-09 NOTE — Op Note (Signed)
[place] Patient Name: Gloria Villa Procedure Date: 07/09/2019 10:12 AM MRN: [id number] Endoscopist: Mauri Pole , MD Age: 69 Referring MD:  Date of Birth: 09-Nov-1950 Gender: Female Account #: [id number] Procedure:                Colonoscopy Indications:              Screening for colorectal malignant neoplasm Medicines:                Monitored Anesthesia Care Procedure:                Pre-Anesthesia Assessment:                           - Prior to the procedure, a History and Physical                            was performed, and patient medications and                            allergies were reviewed. The patient's tolerance of                            previous anesthesia was also reviewed. The risks                            and benefits of the procedure and the sedation                            options and risks were discussed with the patient.                            All questions were answered, and informed consent                            was obtained. Prior Anticoagulants: The patient has                            taken no previous anticoagulant or antiplatelet                            agents. ASA Grade Assessment: II - A patient with                            mild systemic disease. After reviewing the risks                            and benefits, the patient was deemed in                            satisfactory condition to undergo the procedure.                           After obtaining informed consent, the colonoscope  was passed under direct vision. Throughout the                            procedure, the patient's blood pressure, pulse, and                            oxygen saturations were monitored continuously. The                            Colonoscope was introduced through the anus and                            advanced to the the cecum, identified by                            appendiceal orifice  and ileocecal valve. The                            colonoscopy was performed without difficulty. The                            patient tolerated the procedure well. The quality                            of the bowel preparation was excellent. The                            ileocecal valve, appendiceal orifice, and rectum                            were photographed. Scope In: 10:21:38 AM Scope Out: 10:41:51 AM Scope Withdrawal Time: 0 hours 14 minutes 24 seconds  Total Procedure Duration: 0 hours 20 minutes 13 seconds  Findings:                 The perianal and digital rectal examinations were                            normal.                           A 4 mm polyp was found in the ascending colon. The                            polyp was sessile. The polyp was removed with a                            cold snare. Resection and retrieval were complete.                           Four sessile polyps were found in the rectum and                            ascending colon. The polyps were 1 to 2 mm in size.  These polyps were removed with a cold biopsy                            forceps. Resection and retrieval were complete.                           Non-bleeding internal hemorrhoids were found during                            retroflexion. The hemorrhoids were small. Complications:            No immediate complications. Estimated Blood Loss:     Estimated blood loss was minimal. Impression:               - One 4 mm polyp in the ascending colon, removed                            with a cold snare. Resected and retrieved.                           - Four 1 to 2 mm polyps in the rectum and in the                            ascending colon, removed with a cold biopsy                            forceps. Resected and retrieved.                           - Non-bleeding internal hemorrhoids. Recommendation:           - Patient has a contact number available for                             emergencies. The signs and symptoms of potential                            delayed complications were discussed with the                            patient. Return to normal activities tomorrow.                            Written discharge instructions were provided to the                            patient.                           - Resume previous diet.                           - Continue present medications.                           - Await pathology results.                           -  Repeat colonoscopy in 3 - 5 years for                            surveillance based on pathology results. Mauri Pole, MD 07/09/2019 10:50:36 AM This report has been signed electronically.

## 2019-07-11 ENCOUNTER — Telehealth: Payer: Self-pay | Admitting: *Deleted

## 2019-07-11 ENCOUNTER — Encounter: Payer: Self-pay | Admitting: Gastroenterology

## 2019-07-11 NOTE — Telephone Encounter (Signed)
  Follow up Call-  Call back number 07/09/2019  Post procedure Call Back phone  # [phone]  Permission to leave phone message Yes  Some recent data might be hidden     Patient questions:  Do you have a fever, pain , or abdominal swelling? No. Pain Score  0 *  Have you tolerated food without any problems? Yes.    Have you been able to return to your normal activities? Yes.    Do you have any questions about your discharge instructions: Diet   No. Medications  No. Follow up visit  No.  Do you have questions or concerns about your Care? No.  Actions: * If pain score is 4 or above: No action needed, pain <4.  1. Have you developed a fever since your procedure? no  2.   Have you had an respiratory symptoms (SOB or cough) since your procedure? no  3.   Have you tested positive for COVID 19 since your procedure no  4.   Have you had any family members/close contacts diagnosed with the COVID 19 since your procedure?  no   If yes to any of these questions please route to Joylene John, RN and Erenest Rasher, RN

## 2020-08-12 ENCOUNTER — Encounter: Payer: Self-pay | Admitting: Medical-Surgical

## 2020-08-12 ENCOUNTER — Other Ambulatory Visit: Payer: Self-pay

## 2020-08-12 ENCOUNTER — Ambulatory Visit: Payer: BC Managed Care – PPO | Admitting: Medical-Surgical

## 2020-08-12 VITALS — BP 139/82 | HR 78 | Temp 98.3°F | Ht 65.0 in | Wt 170.0 lb

## 2020-08-12 DIAGNOSIS — N183 Chronic kidney disease, stage 3 unspecified: Secondary | ICD-10-CM | POA: Diagnosis not present

## 2020-08-12 DIAGNOSIS — I129 Hypertensive chronic kidney disease with stage 1 through stage 4 chronic kidney disease, or unspecified chronic kidney disease: Secondary | ICD-10-CM | POA: Diagnosis not present

## 2020-08-12 DIAGNOSIS — E782 Mixed hyperlipidemia: Secondary | ICD-10-CM | POA: Diagnosis not present

## 2020-08-12 DIAGNOSIS — D631 Anemia in chronic kidney disease: Secondary | ICD-10-CM | POA: Diagnosis not present

## 2020-08-12 DIAGNOSIS — N250 Renal osteodystrophy: Secondary | ICD-10-CM | POA: Diagnosis not present

## 2020-08-12 DIAGNOSIS — R030 Elevated blood-pressure reading, without diagnosis of hypertension: Secondary | ICD-10-CM

## 2020-08-12 NOTE — Progress Notes (Signed)
Subjective:    CC: Kidney concerns  HPI: Pleasant 70 year old female presenting today to discuss kidney concerns.  She is followed by a nephrologist who is in [place] state.  She sees them virtually and does go for visits in person when required.  Until recently, her kidney function had been holding steady with a GFR in the 40s.  She was first diagnosed with kidney issues in 2016 where her kidney function was found to be approximately 11%.  She had a period of close monitoring and her kidney function rebounded without having to start dialysis.  Most recently, she has been doing well and feeling fine.  She has had no signs or symptoms of worsening kidney function.  Unfortunately on her last check, her GFR had reduced to 26 and when rechecked 2 weeks later, her GFR had reduced to 21.  She is exceedingly concerned about this as she does have a relative that is on dialysis and kidney disease has run in her family.  Admits that she has not been drinking as much fluid lately and often forgets to hydrate when she is busy throughout the day.  After finding out her numbers had decreased, she did have a period where she increased her hydration and was very conscientious of potential injuries to her kidneys.  She has been avoiding over-the-counter medications and is only currently taking a vitamin D supplement.  Notes that her nephrologist recommended she contact her PCP here for further monitoring and management as necessary.  They also requested that her notes and results be sent to them for their review.  I reviewed the past medical history, family history, social history, surgical history, and allergies today and no changes were needed.  Please see the problem list section below in epic for further details.  Past Medical History: Past Medical History:  Diagnosis Date   ANCA-positive vasculitis (HCC)    MPO antibody   Anemia due to chronic kidney disease    Blood transfusion without reported diagnosis     Chronic kidney disease    Cyst of left breast 10/30/2017   stable cyst with internal septation in the left breast at 10 o'clock 5 cm from the nipple measuring 6 x 2 x 8 mm   History of shingles    age 48's   Hypertension    Mixed hyperlipidemia 04/27/2017   no meds ever- will try to control with diet and exercise   Osteopenia of forearm 04/26/2017   pt unsure of this 06-25-19   Overweight with body mass index (BMI) 25.0-29.9 04/05/2017   Renal osteodystrophy    Calcitriol 0.5 mcg QOD   Past Surgical History: Past Surgical History:  Procedure Laterality Date   ABDOMINAL HYSTERECTOMY     partial, fibroids   COLONOSCOPY     RENAL BIOPSY  06/2014   Social History: Social History   Socioeconomic History   Marital status: Married    Spouse name: Not on file   Number of children: Not on file   Years of education: Not on file   Highest education level: Not on file  Occupational History   Occupation: Retired Catering manager  Tobacco Use   Smoking status: Never   Smokeless tobacco: Never  Vaping Use   Vaping Use: Never used  Substance and Sexual Activity   Alcohol use: Yes    Comment: 1-2 drinks/month, wine   Drug use: No   Sexual activity: Yes    Partners: Male    Birth control/protection: Post-menopausal  Other  Topics Concern   Not on file  Social History Narrative   Not on file   Social Determinants of Health   Financial Resource Strain: Not on file  Food Insecurity: Not on file  Transportation Needs: Not on file  Physical Activity: Not on file  Stress: Not on file  Social Connections: Not on file   Family History: Family History  Problem Relation Age of Onset   Liver cancer Mother    Diabetes Mother    Hypertension Mother    Heart attack Father    Diabetes Brother    Hypertension Brother    Stroke Brother    Breast cancer Neg Hx    Colon cancer Neg Hx    Esophageal cancer Neg Hx    Rectal cancer Neg Hx    Stomach cancer Neg Hx    Allergies: Allergies   Allergen Reactions   Sulfa Antibiotics Rash   Medications: See med rec.  Review of Systems: See HPI for pertinent positives and negatives.   Objective:    General: Well Developed, well nourished, and in no acute distress.  Neuro: Alert and oriented x3.  HEENT: Normocephalic, atraumatic.  Skin: Warm and dry. Cardiac: Regular rate and rhythm, no murmurs rubs or gallops, no lower extremity edema.  Respiratory: Clear to auscultation bilaterally. Not using accessory muscles, speaking in full sentences.  Impression and Recommendations:    1. Chronic kidney disease (CKD), active medical management without dialysis, stage 3 (moderate) (HCC) Checking CMP and CBC with differential.  Discussed staying well-hydrated as well as avoiding nephrotoxic medications.  Agree with avoiding over-the-counter medications until verified as safe by a provider. - COMPLETE METABOLIC PANEL WITH GFR - CBC with Differential/Platelet  2. Renal osteodystrophy Checking labs today. - COMPLETE METABOLIC PANEL WITH GFR - CBC with Differential/Platelet  3. Renal hypertension 4. Elevated blood pressure reading Blood pressure slightly elevated in office but home readings have been at goal even without losartan.  Continue to monitor at home with blood pressure goal of 130/80 or less.  If consistently elevated, return for further evaluation. - COMPLETE METABOLIC PANEL WITH GFR - CBC with Differential/Platelet  5. Mixed hyperlipidemia Checking lipid panel today. - Lipid panel  6. Anemia due to stage 3 chronic kidney disease, unspecified whether stage 3a or 3b CKD (HCC) Checking CBC with differential, CMP, and iron panel today. - COMPLETE METABOLIC PANEL WITH GFR - CBC with Differential/Platelet - Iron, TIBC and Ferritin Panel  Return if symptoms worsen or fail to improve.  Further follow-up pending lab results. ___________________________________________ Clearnce Sorrel, DNP, APRN, FNP-BC [facility]

## 2020-08-13 LAB — COMPLETE METABOLIC PANEL WITH GFR
AG Ratio: 1.4 (calc) (ref 1.0–2.5)
ALT: 9 U/L (ref 6–29)
AST: 20 U/L (ref 10–35)
Albumin: 4.4 g/dL (ref 3.6–5.1)
Alkaline phosphatase (APISO): 89 U/L (ref 37–153)
BUN/Creatinine Ratio: 16 (calc) (ref 6–22)
BUN: 35 mg/dL — ABNORMAL HIGH (ref 7–25)
CO2: 24 mmol/L (ref 20–32)
Calcium: 9.5 mg/dL (ref 8.6–10.4)
Chloride: 105 mmol/L (ref 98–110)
Creat: 2.22 mg/dL — ABNORMAL HIGH (ref 0.60–0.93)
GFR, Est African American: 25 mL/min/{1.73_m2} — ABNORMAL LOW (ref >=60)
GFR, Est Non African American: 22 mL/min/{1.73_m2} — ABNORMAL LOW (ref >=60)
Globulin: 3.2 g/dL (calc) (ref 1.9–3.7)
Glucose, Bld: 95 mg/dL (ref 65–99)
Potassium: 4.2 mmol/L (ref 3.5–5.3)
Sodium: 140 mmol/L (ref 135–146)
Total Bilirubin: 0.3 mg/dL (ref 0.2–1.2)
Total Protein: 7.6 g/dL (ref 6.1–8.1)

## 2020-08-13 LAB — CBC WITH DIFFERENTIAL/PLATELET
Absolute Monocytes: 396 cells/uL (ref 200–950)
Basophils Absolute: 42 cells/uL (ref 0–200)
Basophils Relative: 0.7 %
Eosinophils Absolute: 78 cells/uL (ref 15–500)
Eosinophils Relative: 1.3 %
HCT: 31.3 % — ABNORMAL LOW (ref 35.0–45.0)
Hemoglobin: 10.7 g/dL — ABNORMAL LOW (ref 11.7–15.5)
Lymphs Abs: 1332 cells/uL (ref 850–3900)
MCH: 32.6 pg (ref 27.0–33.0)
MCHC: 34.2 g/dL (ref 32.0–36.0)
MCV: 95.4 fL (ref 80.0–100.0)
MPV: 9.2 fL (ref 7.5–12.5)
Monocytes Relative: 6.6 %
Neutro Abs: 4152 cells/uL (ref 1500–7800)
Neutrophils Relative %: 69.2 %
Platelets: 313 10*3/uL (ref 140–400)
RBC: 3.28 10*6/uL — ABNORMAL LOW (ref 3.80–5.10)
RDW: 12.3 % (ref 11.0–15.0)
Total Lymphocyte: 22.2 %
WBC: 6 10*3/uL (ref 3.8–10.8)

## 2020-08-13 LAB — IRON,TIBC AND FERRITIN PANEL
%SAT: 35 % (calc) (ref 16–45)
Ferritin: 244 ng/mL (ref 16–288)
Iron: 104 ug/dL (ref 45–160)
TIBC: 294 mcg/dL (calc) (ref 250–450)

## 2020-08-13 LAB — LIPID PANEL
Cholesterol: 283 mg/dL — ABNORMAL HIGH (ref <200)
HDL: 61 mg/dL (ref >=50)
LDL Cholesterol (Calc): 197 mg/dL (calc) — ABNORMAL HIGH
Non-HDL Cholesterol (Calc): 222 mg/dL (calc) — ABNORMAL HIGH (ref <130)
Total CHOL/HDL Ratio: 4.6 (calc) (ref <5.0)
Triglycerides: 117 mg/dL (ref <150)

## 2020-08-14 ENCOUNTER — Other Ambulatory Visit: Payer: Self-pay

## 2020-08-14 DIAGNOSIS — E782 Mixed hyperlipidemia: Secondary | ICD-10-CM

## 2020-08-14 MED ORDER — ATORVASTATIN CALCIUM 20 MG PO TABS: 20.0000 mg | ORAL_TABLET | Freq: Every day | ORAL | 3 refills | Status: DC

## 2020-08-14 NOTE — Addendum Note (Signed)
Addended bySamuel Bouche on: 08/14/2020 12:29 PM   Modules accepted: Orders

## 2020-10-01 ENCOUNTER — Telehealth: Payer: Self-pay

## 2020-10-01 DIAGNOSIS — Z1231 Encounter for screening mammogram for malignant neoplasm of breast: Secondary | ICD-10-CM

## 2020-10-01 NOTE — Telephone Encounter (Signed)
Pam called and asked for a new referral to the breast center.   She also stated she stopped the atorvastatin after having nausea and constipation. She states the symptoms resolved after stopping the medication. Pt asking for a "more natural" alternative.  Pt also states she [place] nephrolog[place] and has spoken with them regarding this.

## 2020-10-01 NOTE — Telephone Encounter (Signed)
Does she need an order for mammogram or specifically the Breast Center. Per her most recent breast imaging report, they recommended she return to screening mammograms. These can be done in our building. I will be glad to do either but need to know what specifically to order and for what reason.   As for the Atorvastatin, we don't normally see those side effects but if that was the issues, please add to her list of intolerances. There is an herbal supplement called Berberine that has been shown to reduce cholesterol levels in several studies. Because this is an herbal option, it is not regulated by the FDA so finding a good, reputable supplier is key.   Clearnce Sorrel, DNP, APRN, FNP-BC [facility]

## 2020-10-02 NOTE — Addendum Note (Signed)
Addended bySamuel Bouche on: 10/02/2020 05:41 PM   Modules accepted: Orders

## 2020-10-02 NOTE — Telephone Encounter (Signed)
LVM for pt to call to discuss.  T. Geraldean Walen, CMA  

## 2020-10-02 NOTE — Telephone Encounter (Signed)
Pt advised of recommendations. Pt states she is fine with doing a screening mammogram here in our building.

## 2020-10-14 ENCOUNTER — Other Ambulatory Visit: Payer: Self-pay

## 2020-10-14 ENCOUNTER — Ambulatory Visit (INDEPENDENT_AMBULATORY_CARE_PROVIDER_SITE_OTHER): Payer: BC Managed Care – PPO

## 2020-10-14 DIAGNOSIS — Z1231 Encounter for screening mammogram for malignant neoplasm of breast: Secondary | ICD-10-CM

## 2021-02-23 ENCOUNTER — Encounter: Payer: Self-pay | Admitting: Medical-Surgical

## 2021-02-23 ENCOUNTER — Other Ambulatory Visit: Payer: Self-pay

## 2021-02-23 ENCOUNTER — Ambulatory Visit: Payer: BC Managed Care – PPO | Admitting: Medical-Surgical

## 2021-02-23 VITALS — BP 138/84 | HR 79 | Resp 20 | Ht 65.0 in | Wt 171.0 lb

## 2021-02-23 DIAGNOSIS — I129 Hypertensive chronic kidney disease with stage 1 through stage 4 chronic kidney disease, or unspecified chronic kidney disease: Secondary | ICD-10-CM | POA: Diagnosis not present

## 2021-02-23 DIAGNOSIS — E782 Mixed hyperlipidemia: Secondary | ICD-10-CM | POA: Diagnosis not present

## 2021-02-23 MED ORDER — PRAVASTATIN SODIUM 10 MG PO TABS: 10.0000 mg | ORAL_TABLET | Freq: Every day | ORAL | 0 refills | Status: DC

## 2021-02-23 MED ORDER — AMLODIPINE BESYLATE 2.5 MG PO TABS: 2.5000 mg | ORAL_TABLET | Freq: Every day | ORAL | 0 refills | Status: DC

## 2021-02-23 NOTE — Progress Notes (Signed)
°  HPI with pertinent ROS:   CC: HTN/HLD follow up  HPI: Very pleasant 71 year old female presenting today for follow-up on hypertension and hyperlipidemia.  She recently discussed this with her kidney doctor who directed her to see her PCP.  She has had a history of renal hypertension for many years and has been treated with lisinopril as well as losartan.  She was treated long-term with losartan but her nephrologist switched her to a low-dose of lisinopril due to her trending towards hypotension with losartan and the ability to get a lower dose with lisinopril.  Thinks that she was previously treated with amlodipine before starting losartan but is not certain about why she was switched.  Feels that she was told that losartan would be better for her kidneys.  Unfortunately her kidney function has declined despite the use of an ACE/ARB and now her renal provider has taken her off of lisinopril.  Her blood pressure is significantly elevated here in the office today and is running in the upper 130s over 80s at home.  She is interested in what medications may be beneficial for her to manage her blood pressure and further protect her kidneys.  Was previously prescribed atorvastatin 20 mg daily but this caused significant nausea and malaise.  She stopped taking that medication and has not tried anything else so far.  She is open to options to help manage her elevations in and cholesterol as well as reduce her ASCVD risk.  I reviewed the past medical history, family history, social history, surgical history, and allergies today and no changes were needed.  Please see the problem list section below in epic for further details.   Physical exam:   General: Well Developed, well nourished, and in no acute distress.  Neuro: Alert and oriented x3,.  HEENT: Normocephalic, atraumatic.  Skin: Warm and dry. Cardiac: Regular rate and rhythm, no murmurs rubs or gallops, no lower extremity edema.  Respiratory: Clear  to auscultation bilaterally. Not using accessory muscles, speaking in full sentences.  Impression and Recommendations:    1. Renal hypertension Reviewed most recent lab work.  Her GFR is 31 so we will definitely avoid further ACE/ARB medications.  Starting amlodipine 2.5 mg daily.  If response to this medication relates to hypotension, okay to cut tablet in half and take 1.25 mg daily.  Monitor blood pressure at home with goal of 130/80 or less.  Recommend low-sodium diet.  Follow-up with nephrology for upcoming labs.  We will follow-up with her in a couple of weeks for nurse visit for blood pressure check.  2. Mixed hyperlipidemia Discontinue atorvastatin.  Starting pravastatin 10 mg daily.  If she tolerates this well, we may increase to 20 mg daily.  Plan to recheck liver function and lipid panel in 6 weeks.  We can do this as a lab visit only.  Return in about 2 weeks (around 03/09/2021) for nurse visit for BP check. ___________________________________________ Clearnce Sorrel, DNP, APRN, FNP-BC [facility] [place]

## 2021-02-23 NOTE — Progress Notes (Deleted)
°  HPI with pertinent ROS:   CC: Medication problems  HPI:   I reviewed the past medical history, family history, social history, surgical history, and allergies today and no changes were needed.  Please see the problem list section below in epic for further details.   Physical exam:   General: Well Developed, well nourished, and in no acute distress.  Neuro: Alert and oriented x3, extra-ocular muscles intact, sensation grossly intact.  HEENT: Normocephalic, atraumatic, pupils equal round reactive to light, neck supple, no masses, no lymphadenopathy, thyroid nonpalpable.  Skin: Warm and dry, no rashes. Cardiac: Regular rate and rhythm, no murmurs rubs or gallops, no lower extremity edema.  Respiratory: Clear to auscultation bilaterally. Not using accessory muscles, speaking in full sentences.  Impression and Recommendations:    There are no diagnoses linked to this encounter.  No follow-ups on file. ___________________________________________ Clearnce Sorrel, DNP, APRN, FNP-BC [facility] [place]

## 2021-03-09 ENCOUNTER — Ambulatory Visit (INDEPENDENT_AMBULATORY_CARE_PROVIDER_SITE_OTHER): Payer: BC Managed Care – PPO | Admitting: Medical-Surgical

## 2021-03-09 ENCOUNTER — Other Ambulatory Visit: Payer: Self-pay

## 2021-03-09 VITALS — BP 139/75 | HR 82

## 2021-03-09 DIAGNOSIS — I10 Essential (primary) hypertension: Secondary | ICD-10-CM | POA: Diagnosis not present

## 2021-03-09 NOTE — Progress Notes (Signed)
Established Patient Office Visit  Subjective:  Patient ID: Gloria Villa, female    DOB: [birth year]  Age: 71 y.o. MRN: [id number]  CC:  Chief Complaint  Patient presents with   Hypertension    HPI Gloria Villa presents for blood pressure. Denies chest pain, medication problems, dizziness or shortness of breath.   Past Medical History:  Diagnosis Date   ANCA-positive vasculitis    MPO antibody   Anemia due to chronic kidney disease    Blood transfusion without reported diagnosis    Chronic kidney disease    Cyst of left breast 10/30/2017   stable cyst with internal septation in the left breast at 10 o'clock 5 cm from the nipple measuring 6 x 2 x 8 mm   History of shingles    age 70's   Hypertension    Mixed hyperlipidemia 04/27/2017   no meds ever- will try to control with diet and exercise   Osteopenia of forearm 04/26/2017   pt unsure of this 06-25-19   Overweight with body mass index (BMI) 25.0-29.9 04/05/2017   Renal osteodystrophy    Calcitriol 0.5 mcg QOD    Past Surgical History:  Procedure Laterality Date   ABDOMINAL HYSTERECTOMY     partial, fibroids   COLONOSCOPY     RENAL BIOPSY  06/2014    Family History  Problem Relation Age of Onset   Liver cancer Mother    Diabetes Mother    Hypertension Mother    Heart attack Father    Diabetes Brother    Hypertension Brother    Stroke Brother    Breast cancer Neg Hx    Colon cancer Neg Hx    Esophageal cancer Neg Hx    Rectal cancer Neg Hx    Stomach cancer Neg Hx     Social History   Socioeconomic History   Marital status: Married    Spouse name: Not on file   Number of children: Not on file   Years of education: Not on file   Highest education level: Not on file  Occupational History   Occupation: Retired Catering manager  Tobacco Use   Smoking status: Never   Smokeless tobacco: Never  Vaping Use   Vaping Use: Never used  Substance and Sexual Activity   Alcohol use: Yes    Comment: 1-2  drinks/month, wine   Drug use: No   Sexual activity: Yes    Partners: Male    Birth control/protection: Post-menopausal  Other Topics Concern   Not on file  Social History Narrative   Not on file   Social Determinants of Health   Financial Resource Strain: Not on file  Food Insecurity: Not on file  Transportation Needs: Not on file  Physical Activity: Not on file  Stress: Not on file  Social Connections: Not on file  Intimate Partner Violence: Not on file    Outpatient Medications Prior to Visit  Medication Sig Dispense Refill   amLODipine (NORVASC) 2.5 MG tablet Take 1 tablet (2.5 mg total) by mouth daily. 90 tablet 0   Ergocalciferol (VITAMIN D2 PO) Take 1.25 mg by mouth once a week.     pravastatin (PRAVACHOL) 10 MG tablet Take 1 tablet (10 mg total) by mouth daily. 90 tablet 0   No facility-administered medications prior to visit.    Allergies  Allergen Reactions   Sulfa Antibiotics Rash    ROS Review of Systems    Objective:    Physical Exam  BP  139/75    Pulse 82    SpO2 100%  Wt Readings from Last 3 Encounters:  02/23/21 171 lb (77.6 kg)  08/12/20 170 lb (77.1 kg)  07/09/19 165 lb (74.8 kg)     There are no preventive care reminders to display for this patient.  There are no preventive care reminders to display for this patient.  No results found for: TSH Lab Results  Component Value Date   WBC 6.0 08/12/2020   HGB 10.7 (L) 08/12/2020   HCT 31.3 (L) 08/12/2020   MCV 95.4 08/12/2020   PLT 313 08/12/2020   Lab Results  Component Value Date   NA 140 08/12/2020   K 4.2 08/12/2020   CO2 24 08/12/2020   GLUCOSE 95 08/12/2020   BUN 35 (H) 08/12/2020   CREATININE 2.22 (H) 08/12/2020   BILITOT 0.3 08/12/2020   AST 20 08/12/2020   ALT 9 08/12/2020   PROT 7.6 08/12/2020   CALCIUM 9.5 08/12/2020   Lab Results  Component Value Date   CHOL 283 (H) 08/12/2020   Lab Results  Component Value Date   HDL 61 08/12/2020   Lab Results  Component  Value Date   LDLCALC 197 (H) 08/12/2020   Lab Results  Component Value Date   TRIG 117 08/12/2020   Lab Results  Component Value Date   CHOLHDL 4.6 08/12/2020   No results found for: HGBA1C    Assessment & Plan:  HTN - Blood pressure within normal limits. Patient advised to follow up with Joy in 3 months.   Problem List Items Addressed This Visit   None Visit Diagnoses     Essential hypertension, benign    -  Primary       No orders of the defined types were placed in this encounter.   Follow-up: Return in about 3 months (around 06/07/2021) for HTN with Joy. Durene Romans, Monico Blitz, [place]

## 2021-03-31 ENCOUNTER — Emergency Department
Admission: EM | Admit: 2021-03-31 | Discharge: 2021-03-31 | Disposition: A | Discharge status: Home / Self Care | Payer: BC Managed Care – PPO | Source: Home / Self Care | Attending: Family Medicine | Admitting: Family Medicine

## 2021-03-31 ENCOUNTER — Other Ambulatory Visit: Payer: Self-pay

## 2021-03-31 ENCOUNTER — Encounter: Payer: Self-pay | Admitting: Emergency Medicine

## 2021-03-31 DIAGNOSIS — J029 Acute pharyngitis, unspecified: Secondary | ICD-10-CM

## 2021-03-31 LAB — POCT RAPID STREP A (OFFICE): Rapid Strep A Screen: NEGATIVE

## 2021-03-31 NOTE — ED Triage Notes (Addendum)
Cold symptoms x 2 weeks Negative Home COVID last Thursday  Woke up yesterday w/ a sore throat Feels swollen - also has ear pressure  No ibuprofen or tylenol per pt  as instructed by her kidney doctor  Pt is flying out to the [employer] on Friday

## 2021-03-31 NOTE — ED Provider Notes (Signed)
[facility]    CSN: [id number] Arrival date & time: 03/31/21  1004      History   Chief Complaint Chief Complaint  Patient presents with   Sore Throat    HPI Gloria Villa is a 71 y.o. female.   HPI Patient states that she has had cold symptoms for 2 weeks.  She has some sore throat.  She is traveling to the [facility] and wonders whether she might have anything infectious.  Wants to be seen before travel.  She did home COVID test that were negative.  She has had donations.  Her predominant symptom at this time is a sore throat.  She states she is unable to take any over-the-counter cough and cold products, Tylenol, or analgesics because of a history of kidney failure. Past Medical History:  Diagnosis Date   ANCA-positive vasculitis    MPO antibody   Anemia due to chronic kidney disease    Blood transfusion without reported diagnosis    Chronic kidney disease    Cyst of left breast 10/30/2017   stable cyst with internal septation in the left breast at 10 o'clock 5 cm from the nipple measuring 6 x 2 x 8 mm   History of shingles    age 89's   Hypertension    Mixed hyperlipidemia 04/27/2017   no meds ever- will try to control with diet and exercise   Osteopenia of forearm 04/26/2017   pt unsure of this 06-25-19   Overweight with body mass index (BMI) 25.0-29.9 04/05/2017   Renal osteodystrophy    Calcitriol 0.5 mcg QOD    Patient Active Problem List   Diagnosis Date Noted   Cyst of left breast 10/30/2017   Mixed hyperlipidemia 04/27/2017   Osteopenia of forearm 04/26/2017   Abnormal mammogram of left breast 04/24/2017   Chronic kidney disease (CKD), active medical management without dialysis, stage 3 (moderate) ([place]) 04/05/2017   Anemia due to stage 3 chronic kidney disease ([place]) 04/05/2017   Renal hypertension 04/05/2017   ANCA-positive vasculitis 04/05/2017   H/O hysterectomy for benign disease 04/05/2017   Overweight with body mass index (BMI) 25.0-29.9  04/05/2017   Renal osteodystrophy     Past Surgical History:  Procedure Laterality Date   ABDOMINAL HYSTERECTOMY     partial, fibroids   COLONOSCOPY     RENAL BIOPSY  06/2014    OB History     Gravida  2   Para  2   Term      Preterm      AB      Living  2      SAB      IAB      Ectopic      Multiple      Live Births               Home Medications    Prior to Admission medications   Medication Sig Start Date End Date Taking? Authorizing Provider  amLODipine (NORVASC) 2.5 MG tablet Take 1 tablet (2.5 mg total) by mouth daily. 02/23/21   Samuel Bouche, NP  Ergocalciferol (VITAMIN D2 PO) Take 1.25 mg by mouth once a week.    [provider]  pravastatin (PRAVACHOL) 10 MG tablet Take 1 tablet (10 mg total) by mouth daily. 02/23/21   Samuel Bouche, NP    Family History Family History  Problem Relation Age of Onset   Liver cancer Mother    Diabetes Mother    Hypertension  Mother    Heart attack Father    Diabetes Brother    Hypertension Brother    Stroke Brother    Breast cancer Neg Hx    Colon cancer Neg Hx    Esophageal cancer Neg Hx    Rectal cancer Neg Hx    Stomach cancer Neg Hx     Social History Social History   Tobacco Use   Smoking status: Never   Smokeless tobacco: Never  Vaping Use   Vaping Use: Never used  Substance Use Topics   Alcohol use: Yes    Comment: 1-2 drinks/month, wine   Drug use: No     Allergies   Sulfa antibiotics   Review of Systems Review of Systems See HPI  Physical Exam Triage Vital Signs ED Triage Vitals  Enc Vitals Group     BP 03/31/21 1022 132/81     Pulse Rate 03/31/21 1022 87     Resp 03/31/21 1022 15     Temp 03/31/21 1022 98.6 F (37 C)     Temp Source 03/31/21 1022 Oral     SpO2 03/31/21 1022 98 %     Weight 03/31/21 1025 168 lb (76.2 kg)     Height 03/31/21 1025 5\' 5"  (1.651 m)     Head Circumference --      Peak Flow --      Pain Score 03/31/21 1025 2     Pain Loc --       Pain Edu? --      Excl. in [place]? --    No data found.  Updated Vital Signs BP 132/81 (BP Location: Left Arm)    Pulse 87    Temp 98.6 F (37 C) (Oral)    Resp 15    Ht 5\' 5"  (1.651 m)    Wt 76.2 kg    SpO2 98%    BMI 27.96 kg/m      Physical Exam Constitutional:      General: She is not in acute distress.    Appearance: She is well-developed and normal weight.  HENT:     Head: Normocephalic and atraumatic.     Right Ear: Tympanic membrane and ear canal normal. No middle ear effusion. Tympanic membrane is not erythematous.     Left Ear: Tympanic membrane and ear canal normal.  No middle ear effusion. Tympanic membrane is not erythematous.     Nose: No congestion or rhinorrhea.     Mouth/Throat:     Mouth: Mucous membranes are moist.     Tonsils: No tonsillar exudate. 0 on the right. 0 on the left.  Eyes:     Conjunctiva/sclera: Conjunctivae normal.     Pupils: Pupils are equal, round, and reactive to light.  Cardiovascular:     Rate and Rhythm: Normal rate and regular rhythm.     Heart sounds: Normal heart sounds.  Pulmonary:     Effort: Pulmonary effort is normal. No respiratory distress.     Breath sounds: Normal breath sounds.  Abdominal:     General: There is no distension.     Palpations: Abdomen is soft.  Musculoskeletal:        General: Normal range of motion.     Cervical back: Normal range of motion.  Lymphadenopathy:     Cervical: No cervical adenopathy.  Skin:    General: Skin is warm and dry.  Neurological:     Mental Status: She is alert.     UC Treatments / Results  Labs (all labs ordered are listed, but only abnormal results are displayed) Labs Reviewed  CULTURE, GROUP A STREP  POCT RAPID STREP A (OFFICE)    EKG   Radiology No results found.  Procedures Procedures (including critical care time)  Medications Ordered in UC Medications - No data to display  Initial Impression / Assessment and Plan / UC Course  I have reviewed the triage  vital signs and the nursing notes.  Pertinent labs & imaging results that were available during my care of the patient were reviewed by me and considered in my medical decision making (see chart for details).     I see no indication for antibiotics.  Symptomatic care reviewed.  Follow-up as needed Final Clinical Impressions(s) / UC Diagnoses   Final diagnoses:  Viral pharyngitis   Discharge Instructions   None    ED Prescriptions   None    PDMP not reviewed this encounter.   Raylene Everts, MD 03/31/21 1146

## 2021-04-03 LAB — CULTURE, GROUP A STREP: Strep A Culture: NEGATIVE

## 2021-05-17 ENCOUNTER — Other Ambulatory Visit: Payer: Self-pay | Admitting: Medical-Surgical

## 2021-05-19 MED ORDER — PRAVASTATIN SODIUM 10 MG PO TABS: 10.0000 mg | ORAL_TABLET | Freq: Every day | ORAL | 0 refills | Status: DC

## 2021-05-19 MED ORDER — AMLODIPINE BESYLATE 2.5 MG PO TABS: 2.5000 mg | ORAL_TABLET | Freq: Every day | ORAL | 0 refills | Status: DC

## 2021-06-07 ENCOUNTER — Ambulatory Visit: Payer: BC Managed Care – PPO | Admitting: Medical-Surgical

## 2021-06-07 ENCOUNTER — Encounter: Payer: Self-pay | Admitting: Medical-Surgical

## 2021-06-07 VITALS — BP 129/80 | HR 71 | Resp 20 | Ht 65.0 in | Wt 174.6 lb

## 2021-06-07 DIAGNOSIS — I129 Hypertensive chronic kidney disease with stage 1 through stage 4 chronic kidney disease, or unspecified chronic kidney disease: Secondary | ICD-10-CM

## 2021-06-07 DIAGNOSIS — D638 Anemia in other chronic diseases classified elsewhere: Secondary | ICD-10-CM

## 2021-06-07 DIAGNOSIS — D508 Other iron deficiency anemias: Secondary | ICD-10-CM | POA: Insufficient documentation

## 2021-06-07 DIAGNOSIS — N184 Chronic kidney disease, stage 4 (severe): Secondary | ICD-10-CM | POA: Diagnosis not present

## 2021-06-07 DIAGNOSIS — E782 Mixed hyperlipidemia: Secondary | ICD-10-CM

## 2021-06-07 DIAGNOSIS — N183 Chronic kidney disease, stage 3 unspecified: Secondary | ICD-10-CM | POA: Diagnosis not present

## 2021-06-07 NOTE — Progress Notes (Signed)
?  HPI with pertinent ROS:  ? ?CC: HTN follow up ? ?HPI: ?Pleasant 71 year old female presenting today for the following: ? ?Hypertension ?Medication: Amlodipine 2.'5mg'$  nightly  ?Compliant: Yes ?Side effects: None ?Checking BP at home: Yes, readings 104-125/71-80 ?Low sodium diet: Yes ?Exercise: Walking and yard work ?Concerning symptoms: Leg/hand cramps, some lightheadedness after a meal ? ?Hyperlipidemia ?Medication: pravastatin '10mg'$  daily ?Side effects: None ?Low fat diet: Yes ? ?CKD ?Stage: 4 ?Changes in urination: None ?Followed by nephrology: Yes ? ?I reviewed the past medical history, family history, social history, surgical history, and allergies today and no changes were needed.  Please see the problem list section below in epic for further details. ? ? ?Physical exam:  ? ?General: Well Developed, well nourished, and in no acute distress.  ?Neuro: Alert and oriented x3.  ?HEENT: Normocephalic, atraumatic.  ?Skin: Warm and dry. ?Cardiac: Regular rate and rhythm, no murmurs rubs or gallops, no lower extremity edema.  ?Respiratory: Clear to auscultation bilaterally. Not using accessory muscles, speaking in full sentences. ? ?Impression and Recommendations:   ? ?1. Renal hypertension ?Blood pressure at goal today.  Continue amlodipine 2.5 mg daily.  Checking labs as below. ?- COMPLETE METABOLIC PANEL WITH GFR ?- Lipid panel ? ?2. CKD (chronic kidney disease) stage 4, GFR 15-29 ml/min (HCC) ?Most recent BMP showing GFR to 27.  Continue follow-up with nephrology. ? ?3. Anemia of chronic disease ?Stable.  Last CBC checked in January. ? ?4. Mixed hyperlipidemia ?Checking lipid panel today.  Continue pravastatin 10 mg daily. ?- Lipid panel ? ?Return in about 6 months (around 12/07/2021) for HTN follow up. ?___________________________________________ ?Clearnce Sorrel, DNP, APRN, FNP-BC ?Primary Care and Sports Medicine ?[place] ?

## 2021-06-08 LAB — COMPLETE METABOLIC PANEL WITH GFR
AG Ratio: 1.2 (calc) (ref 1.0–2.5)
ALT: 8 U/L (ref 6–29)
AST: 16 U/L (ref 10–35)
Albumin: 4.1 g/dL (ref 3.6–5.1)
Alkaline phosphatase (APISO): 92 U/L (ref 37–153)
BUN/Creatinine Ratio: 13 (calc) (ref 6–22)
BUN: 48 mg/dL — ABNORMAL HIGH (ref 7–25)
CO2: 24 mmol/L (ref 20–32)
Calcium: 9.4 mg/dL (ref 8.6–10.4)
Chloride: 102 mmol/L (ref 98–110)
Creat: 3.56 mg/dL — ABNORMAL HIGH (ref 0.60–1.00)
Globulin: 3.3 g/dL (calc) (ref 1.9–3.7)
Glucose, Bld: 79 mg/dL (ref 65–99)
Potassium: 5.3 mmol/L (ref 3.5–5.3)
Sodium: 136 mmol/L (ref 135–146)
Total Bilirubin: 0.4 mg/dL (ref 0.2–1.2)
Total Protein: 7.4 g/dL (ref 6.1–8.1)
eGFR: 13 mL/min/{1.73_m2} — ABNORMAL LOW (ref >=60)

## 2021-06-08 LAB — LIPID PANEL
Cholesterol: 215 mg/dL — ABNORMAL HIGH (ref <200)
HDL: 58 mg/dL (ref >=50)
LDL Cholesterol (Calc): 132 mg/dL (calc) — ABNORMAL HIGH
Non-HDL Cholesterol (Calc): 157 mg/dL (calc) — ABNORMAL HIGH (ref <130)
Total CHOL/HDL Ratio: 3.7 (calc) (ref <5.0)
Triglycerides: 131 mg/dL (ref <150)

## 2021-06-08 NOTE — Addendum Note (Signed)
Addended bySamuel Bouche on: 06/08/2021 07:20 AM ? ? Modules accepted: Orders ? ?

## 2021-06-14 DIAGNOSIS — N179 Acute kidney failure, unspecified: Secondary | ICD-10-CM | POA: Diagnosis not present

## 2021-06-14 DIAGNOSIS — N184 Chronic kidney disease, stage 4 (severe): Secondary | ICD-10-CM | POA: Diagnosis not present

## 2021-06-17 DIAGNOSIS — N184 Chronic kidney disease, stage 4 (severe): Secondary | ICD-10-CM | POA: Diagnosis not present

## 2021-06-17 DIAGNOSIS — N281 Cyst of kidney, acquired: Secondary | ICD-10-CM | POA: Diagnosis not present

## 2021-06-17 DIAGNOSIS — N179 Acute kidney failure, unspecified: Secondary | ICD-10-CM | POA: Diagnosis not present

## 2021-06-28 DIAGNOSIS — N179 Acute kidney failure, unspecified: Secondary | ICD-10-CM | POA: Diagnosis not present

## 2021-07-12 DIAGNOSIS — I7782 Antineutrophilic cytoplasmic antibody (ANCA) vasculitis: Secondary | ICD-10-CM | POA: Diagnosis not present

## 2021-07-12 DIAGNOSIS — N179 Acute kidney failure, unspecified: Secondary | ICD-10-CM | POA: Diagnosis not present

## 2021-07-20 DIAGNOSIS — M3131 Wegener's granulomatosis with renal involvement: Secondary | ICD-10-CM | POA: Diagnosis not present

## 2021-07-24 DIAGNOSIS — D631 Anemia in chronic kidney disease: Secondary | ICD-10-CM | POA: Diagnosis not present

## 2021-07-24 DIAGNOSIS — I129 Hypertensive chronic kidney disease with stage 1 through stage 4 chronic kidney disease, or unspecified chronic kidney disease: Secondary | ICD-10-CM | POA: Diagnosis not present

## 2021-07-24 DIAGNOSIS — R059 Cough, unspecified: Secondary | ICD-10-CM | POA: Diagnosis not present

## 2021-07-24 DIAGNOSIS — N184 Chronic kidney disease, stage 4 (severe): Secondary | ICD-10-CM | POA: Diagnosis not present

## 2021-07-24 DIAGNOSIS — R918 Other nonspecific abnormal finding of lung field: Secondary | ICD-10-CM | POA: Diagnosis not present

## 2021-07-26 ENCOUNTER — Telehealth: Payer: Self-pay | Admitting: General Practice

## 2021-07-26 NOTE — Telephone Encounter (Signed)
Transition Care Management Follow-up Telephone Call Date of discharge and from where: 08/13/21 from [facility] How have you been since you were released from the hospital? Patient states that she is feeling better and she is taking her antibiotics as prescribed.  Any questions or concerns? No  Items Reviewed: Did the pt receive and understand the discharge instructions provided? Yes  Medications obtained and verified? No  Other? No  Any new allergies since your discharge? No  Dietary orders reviewed? Yes Do you have support at home? Yes   Home Care and Equipment/Supplies: Were home health services ordered? no  Functional Questionnaire: (I = Independent and D = Dependent) ADLs: I  Bathing/Dressing- I  Meal Prep- I  Eating- I  Maintaining continence- I  Transferring/Ambulation- I  Managing Meds- I  Follow up appointments reviewed:  PCP Hospital f/u appt confirmed? No  Patient will call back to schedule if needed. [facility] f/u appt confirmed? No   Are transportation arrangements needed? No  If their condition worsens, is the pt aware to call PCP or go to the Emergency Dept.? Yes Was the patient provided with contact information for the PCP's office or ED? Yes Was to pt encouraged to call back with questions or concerns? Yes

## 2021-08-02 DIAGNOSIS — N179 Acute kidney failure, unspecified: Secondary | ICD-10-CM | POA: Diagnosis not present

## 2021-08-02 DIAGNOSIS — Z79899 Other long term (current) drug therapy: Secondary | ICD-10-CM | POA: Diagnosis not present

## 2021-08-02 DIAGNOSIS — I7782 Antineutrophilic cytoplasmic antibody (ANCA) vasculitis: Secondary | ICD-10-CM | POA: Diagnosis not present

## 2021-08-04 ENCOUNTER — Encounter: Payer: Self-pay | Admitting: Medical-Surgical

## 2021-08-05 DIAGNOSIS — Z79620 Long term (current) use of immunosuppressive biologic: Secondary | ICD-10-CM | POA: Diagnosis not present

## 2021-08-05 DIAGNOSIS — I7782 Antineutrophilic cytoplasmic antibody (ANCA) vasculitis: Secondary | ICD-10-CM | POA: Diagnosis not present

## 2021-08-05 MED ORDER — AMLODIPINE BESYLATE 5 MG PO TABS
5.0000 mg | ORAL_TABLET | Freq: Every day | ORAL | 1 refills | Status: DC
Start: 2021-08-05 — End: 2022-01-21

## 2021-08-11 DIAGNOSIS — I7782 Antineutrophilic cytoplasmic antibody (ANCA) vasculitis: Secondary | ICD-10-CM | POA: Diagnosis not present

## 2021-08-11 DIAGNOSIS — Z79899 Other long term (current) drug therapy: Secondary | ICD-10-CM | POA: Diagnosis not present

## 2021-08-11 DIAGNOSIS — N179 Acute kidney failure, unspecified: Secondary | ICD-10-CM | POA: Diagnosis not present

## 2021-08-11 DIAGNOSIS — N184 Chronic kidney disease, stage 4 (severe): Secondary | ICD-10-CM | POA: Diagnosis not present

## 2021-08-16 ENCOUNTER — Other Ambulatory Visit: Payer: Self-pay

## 2021-08-16 MED ORDER — PRAVASTATIN SODIUM 10 MG PO TABS
10.0000 mg | ORAL_TABLET | Freq: Every day | ORAL | 0 refills | Status: DC
Start: 2021-08-16 — End: 2021-08-16

## 2021-08-16 MED ORDER — VITAMIN D (ERGOCALCIFEROL) 1.25 MG (50000 UNIT) PO CAPS: 50000.0000 [IU] | ORAL_CAPSULE | ORAL | 1 refills | Status: DC

## 2021-08-16 MED ORDER — PRAVASTATIN SODIUM 10 MG PO TABS: 10.0000 mg | ORAL_TABLET | Freq: Every day | ORAL | 0 refills | Status: DC

## 2021-08-16 NOTE — Progress Notes (Signed)
Refill sent. ___________________________________________ Adah Stoneberg L. Kamauri Kathol, DNP, APRN, FNP-BC Primary Care and Sports Medicine [facility]  

## 2021-08-19 DIAGNOSIS — Z79620 Long term (current) use of immunosuppressive biologic: Secondary | ICD-10-CM | POA: Diagnosis not present

## 2021-08-19 DIAGNOSIS — I7782 Antineutrophilic cytoplasmic antibody (ANCA) vasculitis: Secondary | ICD-10-CM | POA: Diagnosis not present

## 2021-09-10 DIAGNOSIS — Z79899 Other long term (current) drug therapy: Secondary | ICD-10-CM | POA: Diagnosis not present

## 2021-09-10 DIAGNOSIS — I7782 Antineutrophilic cytoplasmic antibody (ANCA) vasculitis: Secondary | ICD-10-CM | POA: Diagnosis not present

## 2021-09-10 DIAGNOSIS — N179 Acute kidney failure, unspecified: Secondary | ICD-10-CM | POA: Diagnosis not present

## 2021-09-10 DIAGNOSIS — N184 Chronic kidney disease, stage 4 (severe): Secondary | ICD-10-CM | POA: Diagnosis not present

## 2021-10-01 DIAGNOSIS — N390 Urinary tract infection, site not specified: Secondary | ICD-10-CM | POA: Diagnosis not present

## 2021-10-01 DIAGNOSIS — R319 Hematuria, unspecified: Secondary | ICD-10-CM | POA: Diagnosis not present

## 2021-10-07 DIAGNOSIS — N184 Chronic kidney disease, stage 4 (severe): Secondary | ICD-10-CM | POA: Diagnosis not present

## 2021-10-07 DIAGNOSIS — I7782 Antineutrophilic cytoplasmic antibody (ANCA) vasculitis: Secondary | ICD-10-CM | POA: Diagnosis not present

## 2021-10-07 DIAGNOSIS — Z79899 Other long term (current) drug therapy: Secondary | ICD-10-CM | POA: Diagnosis not present

## 2021-12-07 ENCOUNTER — Ambulatory Visit: Payer: BC Managed Care – PPO | Admitting: Medical-Surgical

## 2021-12-07 ENCOUNTER — Encounter: Payer: Self-pay | Admitting: Medical-Surgical

## 2021-12-07 VITALS — BP 130/75 | HR 79 | Ht 65.0 in | Wt 178.0 lb

## 2021-12-07 DIAGNOSIS — E782 Mixed hyperlipidemia: Secondary | ICD-10-CM | POA: Diagnosis not present

## 2021-12-07 DIAGNOSIS — I129 Hypertensive chronic kidney disease with stage 1 through stage 4 chronic kidney disease, or unspecified chronic kidney disease: Secondary | ICD-10-CM

## 2021-12-07 MED ORDER — PRAVASTATIN SODIUM 10 MG PO TABS: 10.0000 mg | ORAL_TABLET | Freq: Every day | ORAL | 0 refills | Status: DC

## 2021-12-07 NOTE — Progress Notes (Signed)
Medical screening examination/treatment was performed by qualified nurse practitioner student and as supervising provider I was immediately available for consultation/collaboration. I have reviewed documentation and agree with assessment and plan. ° °Eveleen Mcnear L. Saachi Zale, DNP, APRN, FNP-BC °[facility] °Primary Care and Sports Medicine ° °

## 2021-12-07 NOTE — Progress Notes (Signed)
   Established Patient Office Visit  Subjective   Patient ID: Belinda Schlichting, female    DOB: 1950-08-13  Age: 71 y.o. MRN: [id number]  No chief complaint on file.   HPI  Hypertension:  Medication: Amlodipine 5 mg, daily.  Compliant: Yes Side effects: Denies Checking BP at home: yes, readings: Systolic of around 749S and diastolic around 49Q. Low sodium diet: She reports consuming a low sodium diet.  Exercise: She stated that she has been walking 2 miles a day, six days a week.  Concerning symptoms: denies.   Hyperlipidemia:   Medication: Pravastatin 10 mg, daily. Side effects: Denies any issue with this medication. Low fat diet: She reports consuming a good diet.     Review of Systems  Constitutional: Negative.   HENT: Negative.    Respiratory: Negative.    Cardiovascular: Negative.   Genitourinary:        She reports getting up 2-3 times a night to urinate.   Neurological: Negative.       Objective:     BP 130/75   Pulse 79   Ht '5\' 5"'$  (1.651 m)   Wt 80.7 kg   SpO2 99%   BMI 29.62 kg/m    Physical Exam Constitutional:      General: She is not in acute distress.    Appearance: Normal appearance. She is not ill-appearing, toxic-appearing or diaphoretic.  HENT:     Head: Normocephalic and atraumatic.     Right Ear: Tympanic membrane normal.     Left Ear: Tympanic membrane normal.  Cardiovascular:     Rate and Rhythm: Normal rate and regular rhythm.     Pulses: Normal pulses.     Heart sounds: Normal heart sounds.  Pulmonary:     Effort: Pulmonary effort is normal.     Breath sounds: Normal breath sounds.  Musculoskeletal:     Right lower leg: Edema present.     Left lower leg: Pitting Edema present.  Skin:    General: Skin is warm and dry.  Neurological:     General: No focal deficit present.     Mental Status: She is alert and oriented to person, place, and time. Mental status is at baseline.  Psychiatric:        Mood and Affect: Mood normal.         Behavior: Behavior normal.        Thought Content: Thought content normal.        Judgment: Judgment normal.      No results found for any visits on 12/07/21.    The 10-year ASCVD risk score (Arnett DK, et al., 2019) is: 14.5%    Assessment & Plan:   1. Renal hypertension We will continue with Amlodipine 5 mg, daily along with consuming low sodium diet. Her physical activity have improved as well. Of note, she has an upcoming follow up with Nephrology next month and we will follow up closely with their plans and recommendations. .   2. Mixed hyperlipidemia We will continue current regimen of Pravachol 10 mg as she is tolerating this well. We provided her with the requisition form for Lipid panel that she can get drawn at her convenience. Otherwise advised to continue consuming foods high in unsaturated fat and avoid saturated and trans fats.  - Lipid panel    Return in about 6 months (around 06/08/2022) for chronic disease follow up.    Ralph Leyden, RN Student NP

## 2022-01-04 DIAGNOSIS — N184 Chronic kidney disease, stage 4 (severe): Secondary | ICD-10-CM | POA: Diagnosis not present

## 2022-01-04 DIAGNOSIS — I7782 Antineutrophilic cytoplasmic antibody (ANCA) vasculitis: Secondary | ICD-10-CM | POA: Diagnosis not present

## 2022-01-04 DIAGNOSIS — Z79899 Other long term (current) drug therapy: Secondary | ICD-10-CM | POA: Diagnosis not present

## 2022-01-21 ENCOUNTER — Other Ambulatory Visit: Payer: Self-pay | Admitting: Medical-Surgical

## 2022-03-30 ENCOUNTER — Other Ambulatory Visit: Payer: Self-pay | Admitting: Medical-Surgical

## 2022-04-08 ENCOUNTER — Other Ambulatory Visit: Payer: Self-pay | Admitting: Medical-Surgical

## 2022-04-08 DIAGNOSIS — I7782 Antineutrophilic cytoplasmic antibody (ANCA) vasculitis: Secondary | ICD-10-CM | POA: Diagnosis not present

## 2022-04-08 DIAGNOSIS — N184 Chronic kidney disease, stage 4 (severe): Secondary | ICD-10-CM | POA: Diagnosis not present

## 2022-06-09 ENCOUNTER — Encounter: Payer: Self-pay | Admitting: Medical-Surgical

## 2022-06-09 ENCOUNTER — Ambulatory Visit: Payer: BC Managed Care – PPO | Admitting: Medical-Surgical

## 2022-06-09 VITALS — BP 157/74 | HR 73 | Resp 20 | Ht 65.0 in | Wt 182.7 lb

## 2022-06-09 DIAGNOSIS — E782 Mixed hyperlipidemia: Secondary | ICD-10-CM | POA: Diagnosis not present

## 2022-06-09 DIAGNOSIS — I129 Hypertensive chronic kidney disease with stage 1 through stage 4 chronic kidney disease, or unspecified chronic kidney disease: Secondary | ICD-10-CM | POA: Diagnosis not present

## 2022-06-09 DIAGNOSIS — D508 Other iron deficiency anemias: Secondary | ICD-10-CM | POA: Diagnosis not present

## 2022-06-09 DIAGNOSIS — N184 Chronic kidney disease, stage 4 (severe): Secondary | ICD-10-CM

## 2022-06-09 DIAGNOSIS — R221 Localized swelling, mass and lump, neck: Secondary | ICD-10-CM

## 2022-06-09 DIAGNOSIS — I776 Arteritis, unspecified: Secondary | ICD-10-CM

## 2022-06-09 DIAGNOSIS — I7782 Antineutrophilic cytoplasmic antibody (ANCA) vasculitis: Secondary | ICD-10-CM

## 2022-06-09 NOTE — Progress Notes (Signed)
        Established patient visit  History, exam, impression, and plan:  1. Renal hypertension Very pleasant 72 year old female presents today with a history of renovascular hypertension.  Has been taking amlodipine 5 mg daily, tolerating well without side effects.  Notes that her most recent prescription of amlodipine was provided with a change in the appearance of the tablets.  These tablets are extremely small and would be difficult to cut in half.  Over the last few weeks, she notes that her blood pressures have been running higher than usual at home.  Blood pressure on arrival today is elevated and recheck was still high.  Follows a low-sodium diet.  Activity as tolerated.  Denies concerning symptoms.  On exam, HRRR, S1/S2 normal.  Lungs CTA.  Respirations even and unlabored.  No peripheral edema.  Advised her to contact her pharmacy or drop one of her amlodipine tablets by so we can verify that she received the correct medication.  If she did get the correct medication, recommend we increase her amlodipine to 7.5 mg daily.  Monitor blood pressure at home with a goal of 130/80 or less.  2. CKD (chronic kidney disease) stage 4, GFR 15-29 ml/min Followed by nephrology.  3. Mixed hyperlipidemia Currently treated with pravastatin 10 mg daily.  Tolerating well without side effects.  Following a low-fat heart healthy diet.  Checking lipid panel today.  Titrate pravastatin dose depending on results. - Lipid panel  4. Iron deficiency anemia secondary to inadequate dietary iron intake History of iron deficiency anemia with oral iron replacement.  Rechecking CBC with differential. 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  6. Arteritis, unspecified Resolved.    7. Lump in neck Unclear etiology.  Patient reports that she has had a "tight" feeling in the right side of her neck especially when eating/swallowing over the last couple of months.  On exam, no  significant cervical lymphadenopathy however she does have a small area on the right side that could possibly be a thyroid nodule versus an enlarged lymph node.  Checking TSH and CBC today.  Will plan for ultrasound of the soft tissue and thyroid for further evaluation. - CBC with Differential/Platelet - TSH - US THYROID; Future - US SOFT TISSUE HEAD & NECK (NON-THYROID); Future  Procedures performed this visit: None.  Return in about 2 weeks (around 06/23/2022) for nurse visit for BP check.  __________________________________ Thayer Ohm, DNP, APRN, FNP-BC Primary Care and Sports Medicine [facility] [place]

## 2022-06-10 ENCOUNTER — Encounter: Payer: Self-pay | Admitting: Medical-Surgical

## 2022-06-10 LAB — CBC WITH DIFFERENTIAL/PLATELET
Absolute Monocytes: 512 cells/uL (ref 200–950)
Basophils Absolute: 79 cells/uL (ref 0–200)
Basophils Relative: 1.3 %
Eosinophils Absolute: 79 cells/uL (ref 15–500)
Eosinophils Relative: 1.3 %
HCT: 35.2 % (ref 35.0–45.0)
Hemoglobin: 11.8 g/dL (ref 11.7–15.5)
Lymphs Abs: 1269 cells/uL (ref 850–3900)
MCH: 32.3 pg (ref 27.0–33.0)
MCHC: 33.5 g/dL (ref 32.0–36.0)
MCV: 96.4 fL (ref 80.0–100.0)
MPV: 9 fL (ref 7.5–12.5)
Monocytes Relative: 8.4 %
Neutro Abs: 4160 cells/uL (ref 1500–7800)
Neutrophils Relative %: 68.2 %
Platelets: 345 10*3/uL (ref 140–400)
RBC: 3.65 10*6/uL — ABNORMAL LOW (ref 3.80–5.10)
RDW: 12.7 % (ref 11.0–15.0)
Total Lymphocyte: 20.8 %
WBC: 6.1 10*3/uL (ref 3.8–10.8)

## 2022-06-10 LAB — LIPID PANEL
Cholesterol: 322 mg/dL — ABNORMAL HIGH (ref <200)
HDL: 79 mg/dL (ref >=50)
LDL Cholesterol (Calc): 219 mg/dL (calc) — ABNORMAL HIGH
Non-HDL Cholesterol (Calc): 243 mg/dL (calc) — ABNORMAL HIGH (ref <130)
Total CHOL/HDL Ratio: 4.1 (calc) (ref <5.0)
Triglycerides: 110 mg/dL (ref <150)

## 2022-06-10 LAB — TSH: TSH: 1.95 mIU/L (ref 0.40–4.50)

## 2022-06-10 MED ORDER — PRAVASTATIN SODIUM 20 MG PO TABS: 20.0000 mg | ORAL_TABLET | Freq: Every day | ORAL | 0 refills | Status: DC

## 2022-06-10 MED ORDER — AMLODIPINE BESYLATE 2.5 MG PO TABS: 2.5000 mg | ORAL_TABLET | Freq: Every day | ORAL | 0 refills | Status: DC

## 2022-06-10 NOTE — Addendum Note (Signed)
Addended byChristen Butter on: 06/10/2022 07:31 AM   Modules accepted: Orders

## 2022-06-13 ENCOUNTER — Ambulatory Visit (INDEPENDENT_AMBULATORY_CARE_PROVIDER_SITE_OTHER): Payer: BC Managed Care – PPO

## 2022-06-13 DIAGNOSIS — R221 Localized swelling, mass and lump, neck: Secondary | ICD-10-CM | POA: Diagnosis not present

## 2022-06-13 DIAGNOSIS — E041 Nontoxic single thyroid nodule: Secondary | ICD-10-CM | POA: Diagnosis not present

## 2022-06-23 ENCOUNTER — Ambulatory Visit (INDEPENDENT_AMBULATORY_CARE_PROVIDER_SITE_OTHER): Payer: BC Managed Care – PPO | Admitting: Medical-Surgical

## 2022-06-23 VITALS — BP 141/69 | Ht 65.0 in | Wt 182.8 lb

## 2022-06-23 DIAGNOSIS — I129 Hypertensive chronic kidney disease with stage 1 through stage 4 chronic kidney disease, or unspecified chronic kidney disease: Secondary | ICD-10-CM

## 2022-06-23 NOTE — Progress Notes (Signed)
Agree with documentation as below.  Reviewed home readings and found that these are at goal.  Continue current regimen and follow-up with me in 3 months to ensure blood pressure remains stable and well-controlled.  ___________________________________________ Thayer Ohm, DNP, APRN, FNP-BC Primary Care and Sports Medicine [facility]

## 2022-06-23 NOTE — Progress Notes (Signed)
   Subjective:    Patient ID: Gloria Villa, female    DOB: Dec 01, 1950, 72 y.o.   MRN: [id number]  HPI Pt is her for BP check. Amlodipine was increased to 7.5 mg. She has provided home readings: 112/69, 134/77, 113/71, 130/75, 120/75, 127/71 and 124/83. Which will be scanned into her chart. She denies problems with medication. She denies SOB, Chest pain, visual changes, headache or lower extremity edema.    Review of Systems     Objective:   Physical Exam        Assessment & Plan:  1st reading in office is 150/74. Her 2nd reading is 141/69. She states that it is always high when coming to the office. Joy advised that she continue to take medication as directed and continue home readings. Pt will follow up in 3 months.

## 2022-07-07 DIAGNOSIS — N184 Chronic kidney disease, stage 4 (severe): Secondary | ICD-10-CM | POA: Diagnosis not present

## 2022-07-07 DIAGNOSIS — I7782 Antineutrophilic cytoplasmic antibody (ANCA) vasculitis: Secondary | ICD-10-CM | POA: Diagnosis not present

## 2022-07-07 DIAGNOSIS — Z79899 Other long term (current) drug therapy: Secondary | ICD-10-CM | POA: Diagnosis not present

## 2022-08-08 DIAGNOSIS — I7782 Antineutrophilic cytoplasmic antibody (ANCA) vasculitis: Secondary | ICD-10-CM | POA: Diagnosis not present

## 2022-08-08 DIAGNOSIS — Z79620 Long term (current) use of immunosuppressive biologic: Secondary | ICD-10-CM | POA: Diagnosis not present

## 2022-08-09 ENCOUNTER — Encounter: Payer: Self-pay | Admitting: Medical-Surgical

## 2022-08-09 ENCOUNTER — Other Ambulatory Visit: Payer: Self-pay | Admitting: Medical-Surgical

## 2022-08-10 ENCOUNTER — Other Ambulatory Visit: Payer: Self-pay | Admitting: Medical-Surgical

## 2022-08-10 MED ORDER — CARVEDILOL 3.125 MG PO TABS: 3.1250 mg | ORAL_TABLET | Freq: Two times a day (BID) | ORAL | 3 refills | Status: DC

## 2022-08-14 ENCOUNTER — Other Ambulatory Visit: Payer: Self-pay | Admitting: Medical-Surgical

## 2022-08-29 ENCOUNTER — Ambulatory Visit (INDEPENDENT_AMBULATORY_CARE_PROVIDER_SITE_OTHER): Payer: BC Managed Care – PPO | Admitting: Medical-Surgical

## 2022-08-29 VITALS — BP 137/87 | HR 62 | Ht 65.0 in | Wt 182.0 lb

## 2022-08-29 DIAGNOSIS — I129 Hypertensive chronic kidney disease with stage 1 through stage 4 chronic kidney disease, or unspecified chronic kidney disease: Secondary | ICD-10-CM

## 2022-08-29 NOTE — Progress Notes (Signed)
Medical screening examination/treatment was performed by qualified clinical staff member and as supervising provider I was immediately available for consultation/collaboration. I have reviewed documentation and agree with assessment and plan. ° °Aidin Doane L. Audyn Dimercurio, DNP, APRN, FNP-BC °[facility] °Primary Care and Sports Medicine ° °

## 2022-08-29 NOTE — Progress Notes (Signed)
Pt came in today for BP check. Pt denies chest pain, shortness of breath, palpitations, headaches or visual symptoms.    Pt will return for F/U visit on 09/22/2022

## 2022-09-05 ENCOUNTER — Other Ambulatory Visit: Payer: Self-pay | Admitting: Medical-Surgical

## 2022-09-05 DIAGNOSIS — E782 Mixed hyperlipidemia: Secondary | ICD-10-CM

## 2022-09-22 ENCOUNTER — Ambulatory Visit: Payer: BC Managed Care – PPO | Admitting: Medical-Surgical

## 2022-09-22 ENCOUNTER — Encounter: Payer: Self-pay | Admitting: Medical-Surgical

## 2022-09-22 VITALS — BP 154/84 | HR 87 | Ht 65.0 in | Wt 179.1 lb

## 2022-09-22 DIAGNOSIS — N184 Chronic kidney disease, stage 4 (severe): Secondary | ICD-10-CM | POA: Diagnosis not present

## 2022-09-22 DIAGNOSIS — E782 Mixed hyperlipidemia: Secondary | ICD-10-CM | POA: Diagnosis not present

## 2022-09-22 DIAGNOSIS — I129 Hypertensive chronic kidney disease with stage 1 through stage 4 chronic kidney disease, or unspecified chronic kidney disease: Secondary | ICD-10-CM

## 2022-09-22 NOTE — Progress Notes (Signed)
        Established patient visit  History, exam, impression, and plan:  1. Mixed hyperlipidemia Very pleasant 72 year old female presenting today with a history of significant hyperlipidemia that has previously been resistant to treatment with statin medications.  She has had a couple of episodes where she did not tolerate statin medications in the past and is now taking pravastatin 20 mg daily.  She is due for recheck of her labs since increasing to the 20 mg dose.  Continues to follow a low-fat heart healthy diet and get activity as tolerated.  Denies concerning symptoms.  Checking CMP and lipid panel today.  For now continue pravastatin.  If cholesterol not better managed, we did discuss options for injectables such as Repatha or Praluent.  If needed, consider referral to the advanced lipid clinic. - Lipid panel - CMP14+EGFR  2. Renal hypertension History of renal hypertension.  She is currently taking amlodipine 5 mg daily along with Coreg 3.125 mg twice daily.  Tolerating both medications well without side effects.  Monitoring her blood pressure at home regularly with readings that are at goal.  Recent nephrology appointment vital signs reviewed showing readings continue to be at goal.  Blood pressure is elevated today but suspect this is related to whitecoat hypertension.  Plan to continue current medications as prescribed.  3. CKD (chronic kidney disease) stage 4, GFR 15-29 ml/min (HCC) Managed by nephrology.  She is following up regularly and being closely monitored.  Procedures performed this visit: None.  Return in about 6 months (around 03/25/2023) for chronic disease follow up.  __________________________________ Thayer Ohm, DNP, APRN, FNP-BC Primary Care and Sports Medicine [facility]

## 2022-09-23 MED ORDER — PRAVASTATIN SODIUM 40 MG PO TABS: 40.0000 mg | ORAL_TABLET | Freq: Every day | ORAL | 1 refills | Status: DC

## 2022-09-23 NOTE — Addendum Note (Signed)
Addended byChristen Butter on: 09/23/2022 07:26 PM   Modules accepted: Orders

## 2022-10-03 ENCOUNTER — Encounter: Payer: Self-pay | Admitting: Medical-Surgical

## 2022-10-20 DIAGNOSIS — N390 Urinary tract infection, site not specified: Secondary | ICD-10-CM | POA: Diagnosis not present

## 2022-10-20 DIAGNOSIS — N184 Chronic kidney disease, stage 4 (severe): Secondary | ICD-10-CM | POA: Diagnosis not present

## 2022-10-20 DIAGNOSIS — R319 Hematuria, unspecified: Secondary | ICD-10-CM | POA: Diagnosis not present

## 2022-10-20 DIAGNOSIS — Z79899 Other long term (current) drug therapy: Secondary | ICD-10-CM | POA: Diagnosis not present

## 2022-10-20 DIAGNOSIS — I7782 Antineutrophilic cytoplasmic antibody (ANCA) vasculitis: Secondary | ICD-10-CM | POA: Diagnosis not present

## 2022-11-08 ENCOUNTER — Other Ambulatory Visit: Payer: Self-pay | Admitting: Medical-Surgical

## 2022-11-11 ENCOUNTER — Ambulatory Visit
Admission: EM | Admit: 2022-11-11 | Discharge: 2022-11-11 | Disposition: A | Discharge status: Home / Self Care | Payer: BC Managed Care – PPO | Attending: Physician Assistant | Admitting: Physician Assistant

## 2022-11-11 DIAGNOSIS — J0120 Acute ethmoidal sinusitis, unspecified: Secondary | ICD-10-CM

## 2022-11-11 MED ORDER — AMOXICILLIN-POT CLAVULANATE 875-125 MG PO TABS: 1.0000 | ORAL_TABLET | Freq: Two times a day (BID) | ORAL | 0 refills | Status: DC

## 2022-11-11 NOTE — ED Provider Notes (Signed)
[facility]    CSN: [id number] Arrival date & time: 11/11/22  0955      History   Chief Complaint Chief Complaint  Patient presents with   Sore Throat    HPI Gloria Villa is a 72 y.o. female.   Patient complains of sinus pressure and sinus pain.  Patient has a history of sinus infections.  Patient reports that she had COVID a week ago.  Patient reports all symptoms have resolved except pain in her left face and left ear.  Patient has had to be on antibiotics in the past for sinus infections.  Patient denies any shortness of breath she denies any difficulty breathing   Sore Throat    Past Medical History:  Diagnosis Date   ANCA-positive vasculitis (HCC)    MPO antibody   Anemia due to chronic kidney disease    Blood transfusion without reported diagnosis    Chronic kidney disease    Cyst of left breast 10/30/2017   stable cyst with internal septation in the left breast at 10 o'clock 5 cm from the nipple measuring 6 x 2 x 8 mm   History of shingles    age 72's   Hypertension    Mixed hyperlipidemia 04/27/2017   no meds ever- will try to control with diet and exercise   Osteopenia of forearm 04/26/2017   pt unsure of this 06-25-19   Overweight with body mass index (BMI) 25.0-29.9 04/05/2017   Renal osteodystrophy    Calcitriol 0.5 mcg QOD    Patient Active Problem List   Diagnosis Date Noted   Iron deficiency anemia secondary to inadequate dietary iron intake 06/07/2021   Cyst of left breast 10/30/2017   Mixed hyperlipidemia 04/27/2017   Osteopenia of forearm 04/26/2017   Abnormal mammogram of left breast 04/24/2017   Chronic kidney disease (CKD), active medical management without dialysis, stage 3 (moderate) (HCC) 04/05/2017   Renal hypertension 04/05/2017   ANCA-positive vasculitis (HCC) 04/05/2017   Renal osteodystrophy    Vasculitis (HCC) 06/23/2014    Past Surgical History:  Procedure Laterality Date   ABDOMINAL HYSTERECTOMY     partial,  fibroids   COLONOSCOPY     RENAL BIOPSY  06/2014    OB History     Gravida  2   Para  2   Term      Preterm      AB      Living  2      SAB      IAB      Ectopic      Multiple      Live Births               Home Medications    Prior to Admission medications   Medication Sig Start Date End Date Taking? Authorizing Provider  amLODipine (NORVASC) 5 MG tablet Take 1 tablet by mouth once daily 08/15/22   Christen Butter, NP  atovaquone [facility]) 750 MG/5ML suspension Take by mouth. 12/02/21   [provider]  carvedilol (COREG) 3.125 MG tablet Take 1 tablet (3.125 mg total) by mouth 2 (two) times daily with a meal. 08/10/22   Christen Butter, NP  cholecalciferol (VITAMIN D3) 25 MCG (1000 UNIT) tablet Take 1,000 Units by mouth daily.    [provider]  ferrous sulfate 325 (65 FE) MG EC tablet Take 1 tablet by mouth daily. 08/11/21   [provider]  pravastatin (PRAVACHOL) 40 MG tablet Take 1 tablet (40 mg  total) by mouth daily. 09/23/22   Christen Butter, NP  riTUXimab-abbs (TRUXIMA IV) Inject into the vein.    [provider]  sodium bicarbonate 650 MG tablet Take 650 mg by mouth 2 (two) times daily. 10/22/21   [provider]    Family History Family History  Problem Relation Age of Onset   Liver cancer Mother    Diabetes Mother    Hypertension Mother    Heart attack Father    Diabetes Brother    Hypertension Brother    Stroke Brother    Breast cancer Neg Hx    Colon cancer Neg Hx    Esophageal cancer Neg Hx    Rectal cancer Neg Hx    Stomach cancer Neg Hx     Social History Social History   Tobacco Use   Smoking status: Never   Smokeless tobacco: Never  Vaping Use   Vaping status: Never Used  Substance Use Topics   Alcohol use: Yes    Comment: 1-2 drinks/month, wine   Drug use: No     Allergies   Dapsone and Sulfa antibiotics   Review of Systems Review of Systems  HENT:  Positive for rhinorrhea, sinus  pressure and sinus pain.   All other systems reviewed and are negative.    Physical Exam Triage Vital Signs ED Triage Vitals [11/11/22 1100]  Encounter Vitals Group     BP 132/81     Systolic BP Percentile      Diastolic BP Percentile      Pulse Rate 73     Resp 17     Temp 98.2 F (36.8 C)     Temp Source Oral     SpO2 97 %     Weight      Height      Head Circumference      Peak Flow      Pain Score 3     Pain Loc      Pain Education      Exclude from Growth Chart    No data found.  Updated Vital Signs BP 132/81 (BP Location: Left Arm)   Pulse 73   Temp 98.2 F (36.8 C) (Oral)   Resp 17   SpO2 97%   Visual Acuity Right Eye Distance:   Left Eye Distance:   Bilateral Distance:    Right Eye Near:   Left Eye Near:    Bilateral Near:     Physical Exam Vitals and nursing note reviewed.  Constitutional:      Appearance: She is well-developed.  HENT:     Head: Normocephalic.     Comments: Tender left maxillary sinus    Right Ear: Tympanic membrane normal.     Left Ear: Tympanic membrane normal.     Mouth/Throat:     Mouth: Mucous membranes are moist.  Cardiovascular:     Rate and Rhythm: Normal rate.  Pulmonary:     Effort: Pulmonary effort is normal.  Abdominal:     General: There is no distension.  Musculoskeletal:        General: Normal range of motion.     Cervical back: Normal range of motion.  Skin:    General: Skin is warm.  Neurological:     General: No focal deficit present.     Mental Status: She is alert and oriented to person, place, and time.      UC Treatments / Results  Labs (all labs ordered are listed, but only  abnormal results are displayed) Labs Reviewed - No data to display  EKG   Radiology No results found.  Procedures Procedures (including critical care time)  Medications Ordered in UC Medications - No data to display  Initial Impression / Assessment and Plan / UC Course  I have reviewed the triage vital  signs and the nursing notes.  Pertinent labs & imaging results that were available during my care of the patient were reviewed by me and considered in my medical decision making (see chart for details).     Patient given a prescription for Augmentin.  She is advised to follow-up with her primary provider for recheck in 1 week if symptoms persist. Final Clinical Impressions(s) / UC Diagnoses   Final diagnoses:  Acute non-recurrent ethmoidal sinusitis     Discharge Instructions      See your Provider for recheck if symptoms persit    ED Prescriptions   None    PDMP not reviewed this encounter. An After Visit Summary was printed and given to the patient.       Elson Areas, [place] 11/11/22 1142

## 2022-11-11 NOTE — ED Triage Notes (Signed)
Pt c/o sore throat and cough x 10+ days.  Denies fever. Tested pos for covid on 9/10. Continuing to have pressure on LT side of face. No OTC meds taken.

## 2022-11-11 NOTE — Discharge Instructions (Addendum)
See your Provider for recheck if symptoms persit

## 2022-11-12 ENCOUNTER — Other Ambulatory Visit: Payer: Self-pay | Admitting: Medical-Surgical

## 2022-11-14 MED ORDER — AMLODIPINE BESYLATE 5 MG PO TABS: 5.0000 mg | ORAL_TABLET | Freq: Every day | ORAL | 1 refills | Status: DC

## 2022-11-25 ENCOUNTER — Other Ambulatory Visit: Payer: Self-pay | Admitting: Medical-Surgical

## 2022-12-13 IMAGING — MG MM DIGITAL SCREENING BILAT W/ TOMO AND CAD
8 series · 8 of 24 positions shown · non-contrast
Comparison: Previous exam(s).

CLINICAL DATA: Screening.

EXAM:
DIGITAL SCREENING BILATERAL MAMMOGRAM WITH TOMOSYNTHESIS AND CAD
TECHNIQUE: Bilateral screening digital craniocaudal and mediolateral oblique
mammograms were obtained. Bilateral screening digital breast
tomosynthesis was performed. The images were evaluated with
computer-aided detection.

[R MLO synth-2D]
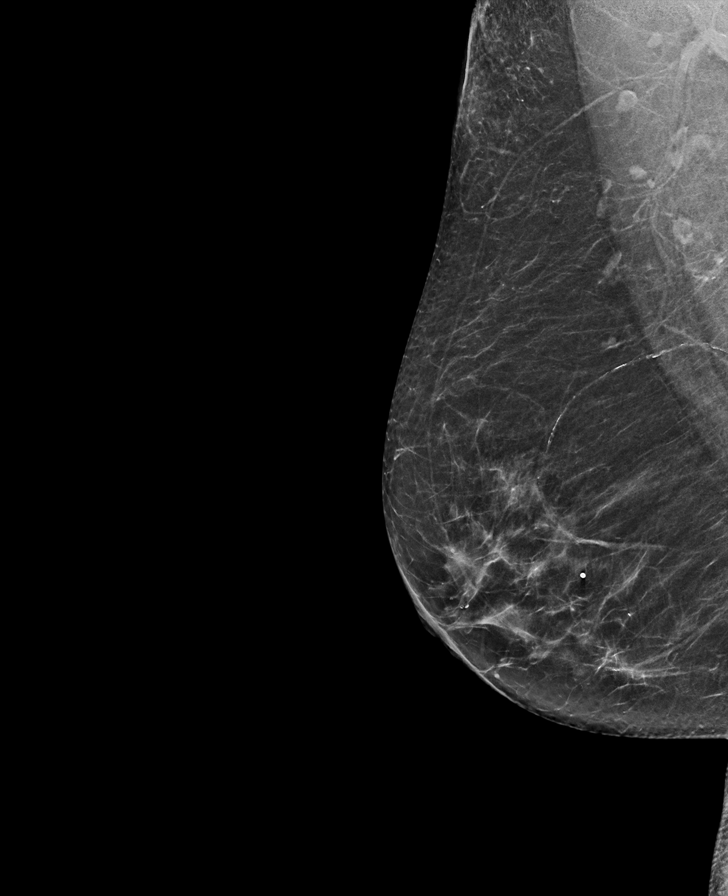

[L MLO synth-2D]
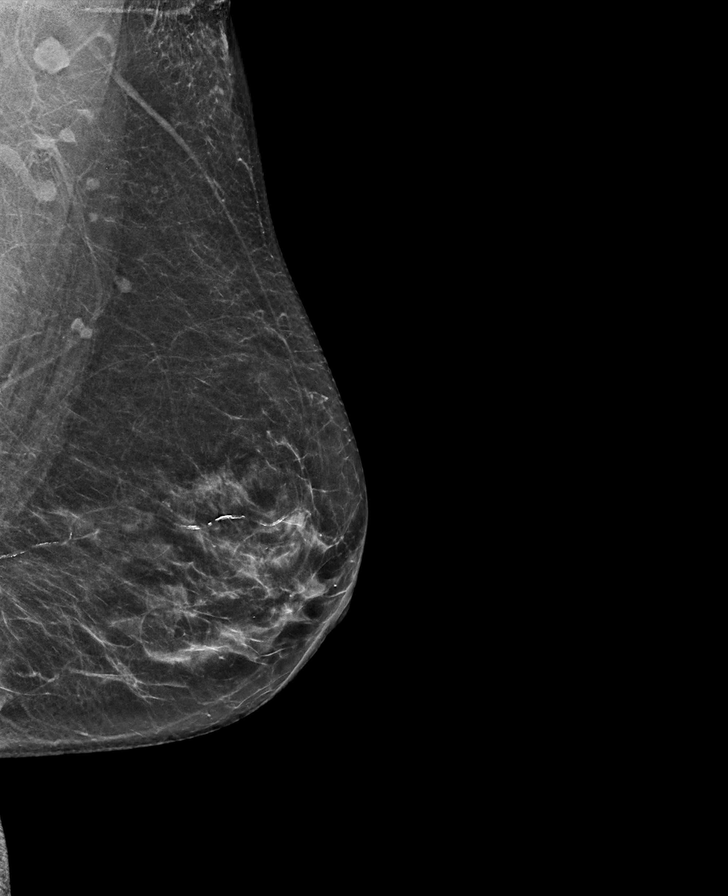

[L CC synth-2D]
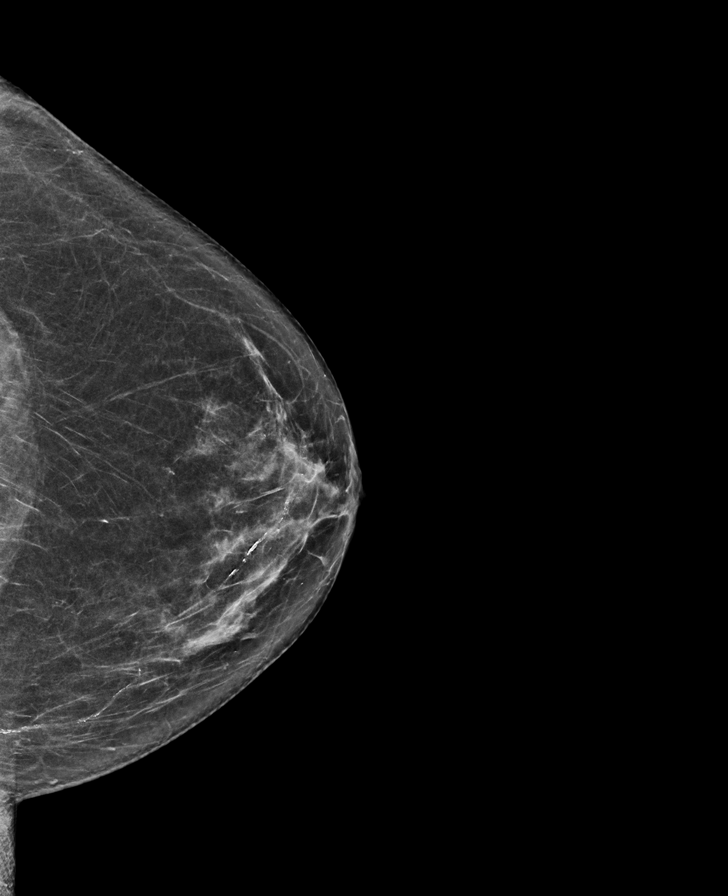

[R CC synth-2D]
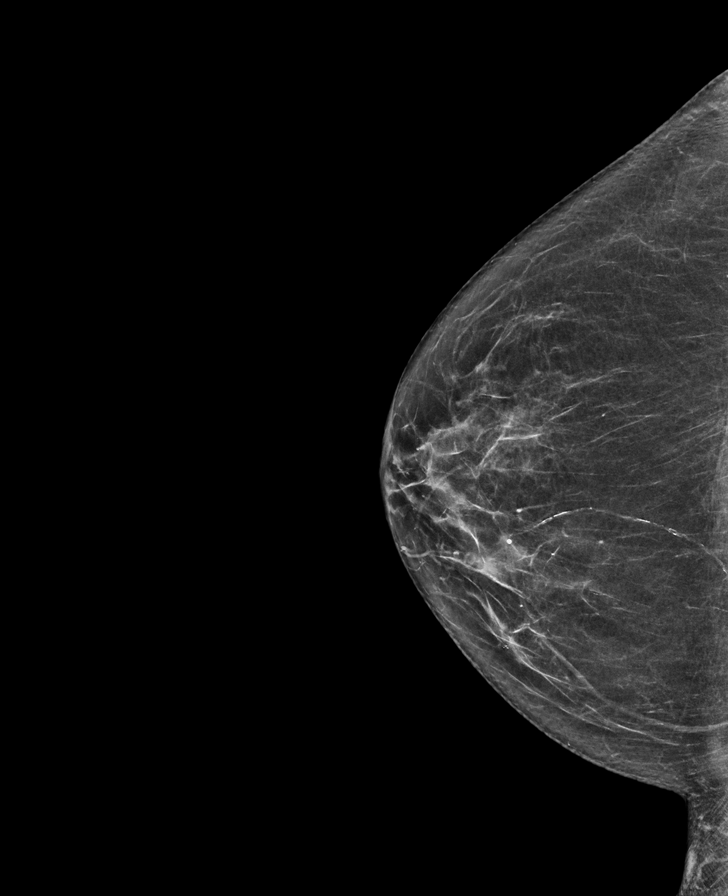

[R CC tomo · tomo slice 31/62.0]
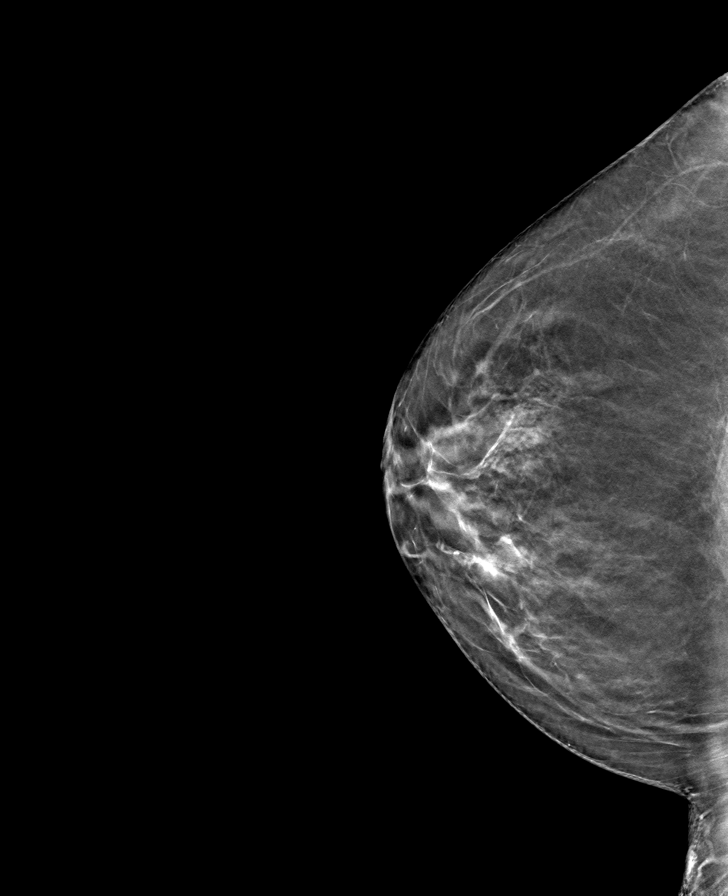

[L CC tomo · tomo slice 33/64.0]
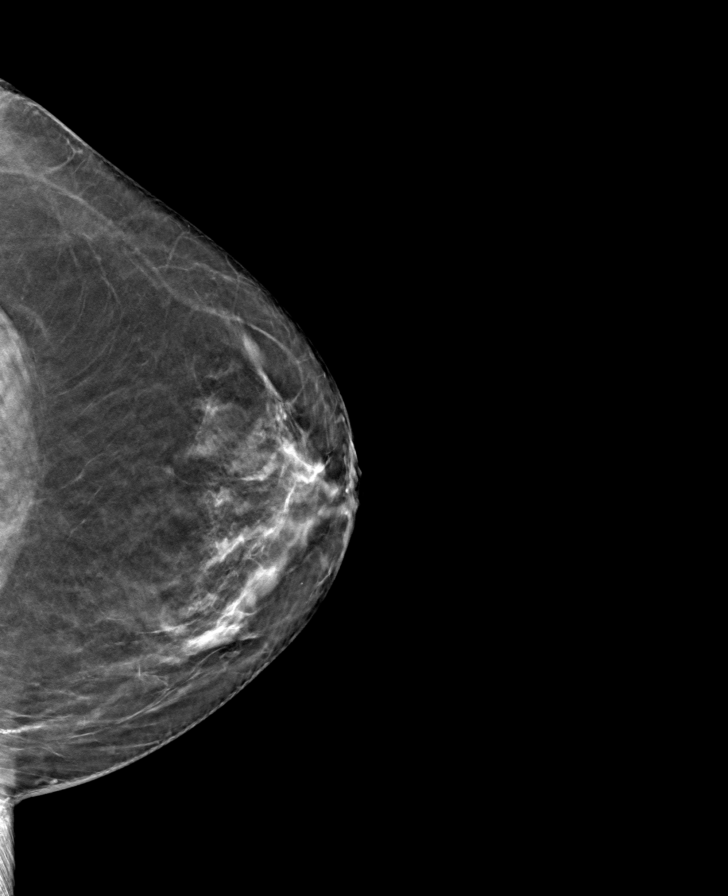

[L MLO tomo · tomo slice 31/62.0]
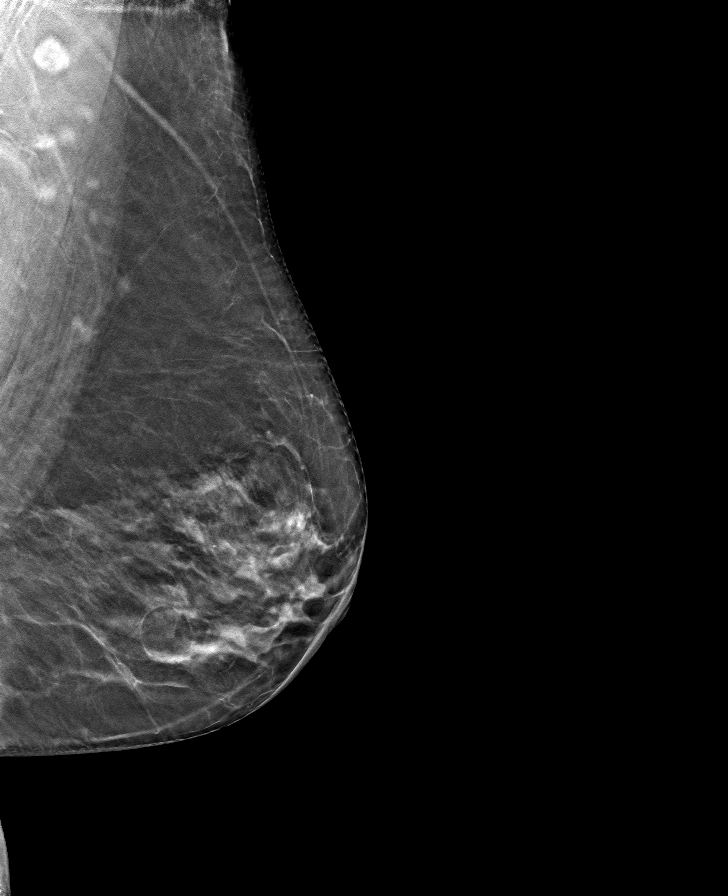

[R MLO tomo · tomo slice 33/65.0]
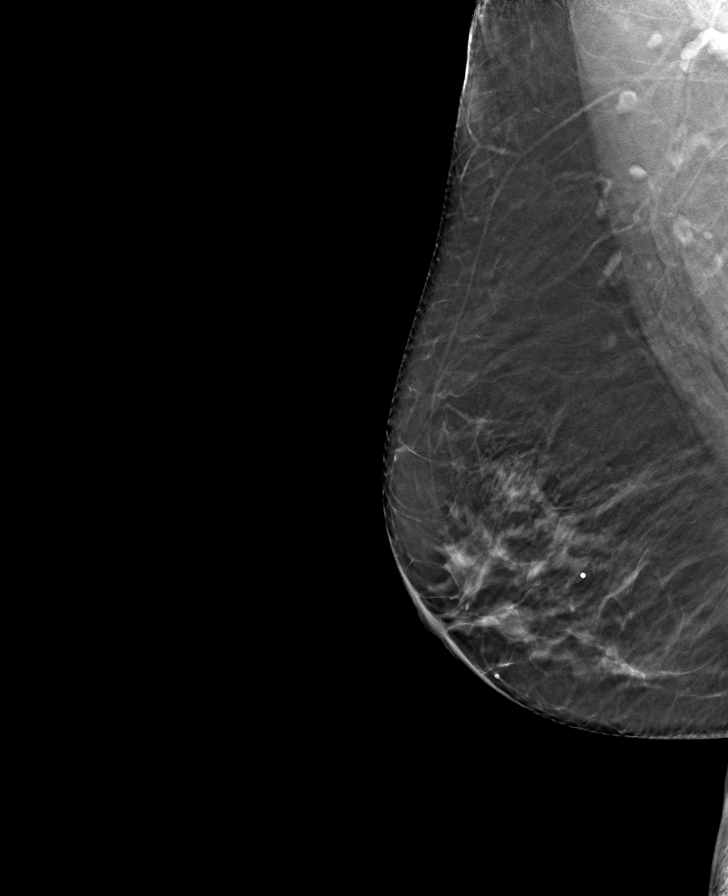

[8 of 24 positions shown; findings below may reference images not displayed]

ACR Breast Density Category b: There are scattered areas of
fibroglandular density.
FINDINGS: There are no findings suspicious for malignancy.
IMPRESSION: No mammographic evidence of malignancy. A result letter of this
screening mammogram will be mailed directly to the patient.

RECOMMENDATION:
Screening mammogram in one year. (Code:51-O-LD2)

BI-RADS CATEGORY  1: Negative.

## 2022-12-23 ENCOUNTER — Other Ambulatory Visit: Payer: Self-pay | Admitting: Medical-Surgical

## 2022-12-27 ENCOUNTER — Encounter: Payer: Self-pay | Admitting: Medical-Surgical

## 2022-12-27 ENCOUNTER — Other Ambulatory Visit: Payer: Self-pay | Admitting: Medical-Surgical

## 2023-01-10 ENCOUNTER — Other Ambulatory Visit: Payer: Self-pay | Admitting: Medical-Surgical

## 2023-01-10 DIAGNOSIS — Z1231 Encounter for screening mammogram for malignant neoplasm of breast: Secondary | ICD-10-CM

## 2023-01-18 ENCOUNTER — Ambulatory Visit: Payer: BC Managed Care – PPO

## 2023-01-18 DIAGNOSIS — Z1231 Encounter for screening mammogram for malignant neoplasm of breast: Secondary | ICD-10-CM | POA: Diagnosis not present

## 2023-01-23 ENCOUNTER — Other Ambulatory Visit: Payer: Self-pay | Admitting: Medical-Surgical

## 2023-01-26 DIAGNOSIS — N184 Chronic kidney disease, stage 4 (severe): Secondary | ICD-10-CM | POA: Diagnosis not present

## 2023-01-26 DIAGNOSIS — I7782 Antineutrophilic cytoplasmic antibody (ANCA) vasculitis: Secondary | ICD-10-CM | POA: Diagnosis not present

## 2023-01-30 ENCOUNTER — Other Ambulatory Visit: Payer: Self-pay | Admitting: Medical-Surgical

## 2023-02-26 ENCOUNTER — Other Ambulatory Visit: Payer: Self-pay | Admitting: Medical-Surgical

## 2023-03-01 ENCOUNTER — Ambulatory Visit: Payer: BC Managed Care – PPO

## 2023-03-08 ENCOUNTER — Ambulatory Visit: Payer: BC Managed Care – PPO

## 2023-03-18 ENCOUNTER — Other Ambulatory Visit: Payer: Self-pay | Admitting: Medical-Surgical

## 2023-03-18 DIAGNOSIS — E782 Mixed hyperlipidemia: Secondary | ICD-10-CM

## 2023-03-19 ENCOUNTER — Encounter: Payer: Self-pay | Admitting: Gastroenterology

## 2023-03-29 ENCOUNTER — Encounter: Payer: Self-pay | Admitting: Medical-Surgical

## 2023-03-29 ENCOUNTER — Ambulatory Visit: Payer: BC Managed Care – PPO | Admitting: Medical-Surgical

## 2023-03-29 VITALS — BP 125/79 | HR 69 | Resp 20 | Ht 65.0 in | Wt 179.0 lb

## 2023-03-29 DIAGNOSIS — D508 Other iron deficiency anemias: Secondary | ICD-10-CM

## 2023-03-29 DIAGNOSIS — E782 Mixed hyperlipidemia: Secondary | ICD-10-CM | POA: Diagnosis not present

## 2023-03-29 DIAGNOSIS — I129 Hypertensive chronic kidney disease with stage 1 through stage 4 chronic kidney disease, or unspecified chronic kidney disease: Secondary | ICD-10-CM

## 2023-03-29 DIAGNOSIS — I7782 Antineutrophilic cytoplasmic antibody (ANCA) vasculitis: Secondary | ICD-10-CM

## 2023-03-29 DIAGNOSIS — N183 Chronic kidney disease, stage 3 unspecified: Secondary | ICD-10-CM

## 2023-03-29 DIAGNOSIS — I10 Essential (primary) hypertension: Secondary | ICD-10-CM | POA: Insufficient documentation

## 2023-03-29 DIAGNOSIS — Z1211 Encounter for screening for malignant neoplasm of colon: Secondary | ICD-10-CM

## 2023-03-29 MED ORDER — CARVEDILOL 3.125 MG PO TABS: 3.1250 mg | ORAL_TABLET | Freq: Two times a day (BID) | ORAL | 3 refills | Status: DC

## 2023-03-29 MED ORDER — PRAVASTATIN SODIUM 40 MG PO TABS: 40.0000 mg | ORAL_TABLET | Freq: Every day | ORAL | 3 refills | Status: DC

## 2023-03-29 MED ORDER — AMLODIPINE BESYLATE 5 MG PO TABS: 5.0000 mg | ORAL_TABLET | Freq: Every day | ORAL | 3 refills | Status: DC

## 2023-03-29 NOTE — Progress Notes (Signed)
        Established patient visit  History, exam, impression, and plan:  1. ANCA-positive vasculitis (HCC) (Primary) 2. Chronic kidney disease (CKD), active medical management without dialysis, stage 3 (moderate) (HCC) Very pleasant 73 year old female presenting for follow up. She has ANCA-positive vasculitis along with CKD that is managed by nephrology. Has done infusions and is being monitored closely. No concerns today.   3. Mixed hyperlipidemia Has been taking Pravastatin  40mg  daily. Tolerating well without side effects. Following a low fat heart healthy diet. Staying active and is conscious of weight management recommendations. Checking lipids today. Continue Pravastatin .   4. Iron deficiency anemia secondary to inadequate dietary iron intake History of iron deficiency anemia but no concerns recently. Plan to recheck CBC.   5. Renal hypertension Has been taking Amlodipine  5mg  daily and Coreg  3.125mg  BID. Tolerating well without side effects. Checking BP at home with reports of readings at or below goal. Denies any concerning symptoms. Cardiopulmonary exam normal. BP at goal today. Continue Amlodipine  and Coreg  as prescribed.    Procedures performed this visit: None.  Return in about 6 months (around 09/26/2023) for HTN follow up.  __________________________________ Zada FREDRIK Palin, DNP, APRN, FNP-BC Primary Care and Sports Medicine [facility]

## 2023-03-30 ENCOUNTER — Encounter: Payer: Self-pay | Admitting: Medical-Surgical

## 2023-03-30 LAB — LIPID PANEL
Chol/HDL Ratio: 3.8 {ratio} (ref 0.0–4.4)
Cholesterol, Total: 218 mg/dL — ABNORMAL HIGH (ref 100–199)
HDL: 58 mg/dL (ref >39)
LDL Chol Calc (NIH): 127 mg/dL — ABNORMAL HIGH (ref 0–99)
Triglycerides: 187 mg/dL — ABNORMAL HIGH (ref 0–149)
VLDL Cholesterol Cal: 33 mg/dL (ref 5–40)

## 2023-03-30 LAB — CBC
Hematocrit: 35.2 % (ref 34.0–46.6)
Hemoglobin: 11.5 g/dL (ref 11.1–15.9)
MCH: 31.4 pg (ref 26.6–33.0)
MCHC: 32.7 g/dL (ref 31.5–35.7)
MCV: 96 fL (ref 79–97)
Platelets: 378 10*3/uL (ref 150–450)
RBC: 3.66 x10E6/uL — ABNORMAL LOW (ref 3.77–5.28)
RDW: 12.8 % (ref 11.7–15.4)
WBC: 6.2 10*3/uL (ref 3.4–10.8)

## 2023-03-30 MED ORDER — ROSUVASTATIN CALCIUM 10 MG PO TABS: 10.0000 mg | ORAL_TABLET | Freq: Every day | ORAL | 3 refills | Status: DC

## 2023-04-20 ENCOUNTER — Encounter: Payer: Self-pay | Admitting: Medical-Surgical

## 2023-04-20 NOTE — Telephone Encounter (Signed)
 Patient scheduled.

## 2023-04-21 ENCOUNTER — Ambulatory Visit: Payer: BC Managed Care – PPO | Admitting: Medical-Surgical

## 2023-04-25 ENCOUNTER — Encounter: Payer: Self-pay | Admitting: Medical-Surgical

## 2023-04-25 ENCOUNTER — Ambulatory Visit: Payer: BC Managed Care – PPO | Admitting: Medical-Surgical

## 2023-04-25 VITALS — BP 153/80 | HR 81 | Temp 98.0°F | Ht 65.0 in | Wt 179.0 lb

## 2023-04-25 DIAGNOSIS — H9202 Otalgia, left ear: Secondary | ICD-10-CM | POA: Diagnosis not present

## 2023-04-25 DIAGNOSIS — J40 Bronchitis, not specified as acute or chronic: Secondary | ICD-10-CM

## 2023-04-25 DIAGNOSIS — J329 Chronic sinusitis, unspecified: Secondary | ICD-10-CM

## 2023-04-25 MED ORDER — DOXYCYCLINE HYCLATE 100 MG PO TABS
100.0000 mg | ORAL_TABLET | Freq: Two times a day (BID) | ORAL | 0 refills | Status: DC
Start: 2023-04-25 — End: 2023-06-05

## 2023-04-25 NOTE — Progress Notes (Unsigned)
 Subjective:  Patient ID: Gloria Villa, female    DOB: 1950/12/28, 73 y.o.   MRN: [id number]  Patient Care Team: Christen Butter, NP as PCP - General (Nurse Practitioner) Alyce Pagan as Consulting Physician (Nephrology)   Chief Complaint:  Ear Pain   HPI:  Gloria Villa is a 73 y.o. female presenting on 04/25/2023 for Ear Pain   History, Exam,  Impression and Plan  1. Ear pain, left (Primary) 2. Sinobronchitis Ongoing ear pain since December 2024 when she traveled to [place] and was treated to urgent care for ear infection and vertigo.  Reports that left ear has not resolved continues to bother her with crackling and full feeling and tenderness over left maxillary sinus.  Reports morning coughing up green sputum and nasal discharge that is green and thick and is worse in morning.  Patient not taking any OTCs.  Stopped taking Flonase 1 week after starting because" was not making a difference".  On exam patient sounds congested with tenderness over left sinus.  Pharynx is mildly erythremic without edema.  TMs are intact bilaterally with erythema along bottom of external ear bilaterally.  Left TM unable to visualize bony landmarks and has diffuse/altered light reflex and is visibly bulging.  No fluid line visible or erythemic injection into the TM.  Positive rhonchi over right bronchus with all other length lung fields clear.  Somewhat diminished over posterior right lower lobe. Antibiotics selected and dosed with consideration to hypertension and satge 3 renal disease and renal clearance. Pt to restart and continue Flonase nasal spray.  -     doxycycline (VIBRA-TABS) 100 MG tablet; Take 1 tablet (100 mg total) by mouth 2 (two) times daily.  Continue all other maintenance medications.  Follow up plan: Return in about 1-2 weeks (around 05/02/2023), or if symptoms worsen or fail to improve. If no improvement in symptoms will consider burst or taper of prednisone.   Relevant past  medical, surgical, family, and social history reviewed and updated as indicated.  Allergies and medications reviewed and updated. Data reviewed: Chart in Epic.   Past Medical History:  Diagnosis Date   ANCA-positive vasculitis (HCC)    MPO antibody   Anemia due to chronic kidney disease    Blood transfusion without reported diagnosis    Chronic kidney disease    Cyst of left breast 10/30/2017   stable cyst with internal septation in the left breast at 10 o'clock 5 cm from the nipple measuring 6 x 2 x 8 mm   History of shingles    age 14's   Hypertension    Mixed hyperlipidemia 04/27/2017   no meds ever- will try to control with diet and exercise   Osteopenia of forearm 04/26/2017   pt unsure of this 06-25-19   Overweight with body mass index (BMI) 25.0-29.9 04/05/2017   Renal osteodystrophy    Calcitriol 0.5 mcg QOD    Past Surgical History:  Procedure Laterality Date   ABDOMINAL HYSTERECTOMY     partial, fibroids   COLONOSCOPY     RENAL BIOPSY  06/2014    Social History   Socioeconomic History   Marital status: Married    Spouse name: Not on file   Number of children: Not on file   Years of education: Not on file   Highest education level: Associate degree: occupational, Scientist, product/process development, or vocational program  Occupational History   Occupation: Retired Financial controller  Tobacco Use   Smoking status: Never   Smokeless  tobacco: Never  Vaping Use   Vaping status: Never Used  Substance and Sexual Activity   Alcohol use: Yes    Comment: 1-2 drinks/month, wine   Drug use: No   Sexual activity: Yes    Partners: Male    Birth control/protection: Post-menopausal  Other Topics Concern   Not on file  Social History Narrative   Not on file   Social Drivers of Health   Financial Resource Strain: Low Risk  (03/26/2023)   Overall Financial Resource Strain (CARDIA)    Difficulty of Paying Living Expenses: Not hard at all  Food Insecurity: No Food Insecurity (03/26/2023)   Hunger  Vital Sign    Worried About Running Out of Food in the Last Year: Never true    Ran Out of Food in the Last Year: Never true  Transportation Needs: No Transportation Needs (03/26/2023)   PRAPARE - Administrator, Civil Service (Medical): No    Lack of Transportation (Non-Medical): No  Physical Activity: Sufficiently Active (03/26/2023)   Exercise Vital Sign    Days of Exercise per Week: 3 days    Minutes of Exercise per Session: 50 min  Stress: No Stress Concern Present (03/26/2023)   [employer] of Occupational Health - Occupational Stress Questionnaire    Feeling of Stress : Not at all  Social Connections: Socially Integrated (03/26/2023)   Social Connection and Isolation Panel [NHANES]    Frequency of Communication with Friends and Family: More than three times a week    Frequency of Social Gatherings with Friends and Family: Once a week    Attends Religious Services: More than 4 times per year    Active Member of [employer] or Organizations: Yes    Attends Banker Meetings: More than 4 times per year    Marital Status: Married  Catering manager Violence: Unknown (10/08/2022)   Received from [facility]   HITS    Physically Hurt: Not on file    Insult or Talk Down To: Not on file    Threaten Physical Harm: Not on file    Scream or Curse: Not on file    Outpatient Encounter Medications as of 04/25/2023  Medication Sig   doxycycline (VIBRA-TABS) 100 MG tablet Take 1 tablet (100 mg total) by mouth 2 (two) times daily.   amLODipine (NORVASC) 5 MG tablet Take 1 tablet (5 mg total) by mouth daily.   atovaquone (MEPRON) 750 MG/5ML suspension Take by mouth.   carvedilol (COREG) 3.125 MG tablet Take 1 tablet (3.125 mg total) by mouth 2 (two) times daily with a meal.   cholecalciferol (VITAMIN D3) 25 MCG (1000 UNIT) tablet Take 1,000 Units by mouth daily.   ferrous sulfate 325 (65 FE) MG EC tablet Take 1 tablet by mouth daily.   riTUXimab-abbs (TRUXIMA IV) Inject into  the vein.   rosuvastatin (CRESTOR) 10 MG tablet Take 1 tablet (10 mg total) by mouth daily.   sodium bicarbonate 650 MG tablet Take 650 mg by mouth 2 (two) times daily.   No facility-administered encounter medications on file as of 04/25/2023.    Allergies  Allergen Reactions   Dapsone Swelling and Rash   Sulfa Antibiotics Rash    Review of Systems      Objective:  BP (!) 153/80   Pulse 81   Temp 98 F (36.7 C) (Oral)   Ht 5\' 5"  (1.651 m)   Wt 179 lb (81.2 kg)   SpO2 99%   BMI 29.79 kg/m  Wt Readings from Last 3 Encounters:  04/25/23 179 lb (81.2 kg)  03/29/23 179 lb (81.2 kg)  09/22/22 179 lb 1.3 oz (81.2 kg)    Physical Exam  Results for orders placed or performed in visit on 03/29/23  CBC   Collection Time: 03/29/23 10:51 AM  Result Value Ref Range   WBC 6.2 3.4 - 10.8 x10E3/uL   RBC 3.66 (L) 3.77 - 5.28 x10E6/uL   Hemoglobin 11.5 11.1 - 15.9 g/dL   Hematocrit 40.9 81.1 - 46.6 %   MCV 96 79 - 97 fL   MCH 31.4 26.6 - 33.0 pg   MCHC 32.7 31.5 - 35.7 g/dL   RDW 91.4 78.2 - 95.6 %   Platelets 378 150 - 450 x10E3/uL  Lipid panel   Collection Time: 03/29/23 10:51 AM  Result Value Ref Range   Cholesterol, Total 218 (H) 100 - 199 mg/dL   Triglycerides 213 (H) 0 - 149 mg/dL   HDL 58 >08 mg/dL   VLDL Cholesterol Cal 33 5 - 40 mg/dL   LDL Chol Calc (NIH) 657 (H) 0 - 99 mg/dL   Chol/HDL Ratio 3.8 0.0 - 4.4 ratio       Pertinent labs & imaging results that were available during my care of the patient were reviewed by me and considered in my medical decision making.   Continue healthy lifestyle choices, including diet (rich in fruits, vegetables, and lean proteins, and low in salt and simple carbohydrates) and exercise (at least 30 minutes of moderate physical activity daily).   The above assessment and management plan was discussed with the patient. The patient verbalized understanding of and has agreed to the management plan. Patient is aware to call the  clinic if they develop any new symptoms or if symptoms persist or worsen. Patient is aware when to return to the clinic for a follow-up visit. Patient educated on when it is appropriate to go to the emergency department.   Maryelizabeth Kaufmann Student AGNP

## 2023-04-26 NOTE — Progress Notes (Signed)
 Medical screening examination/treatment was performed by qualified clinical staff member and as supervising provider I was immediately available for consultation/collaboration. I have reviewed documentation and agree with assessment and plan.  Thayer Ohm, DNP, APRN, FNP-BC [facility] and [facility]

## 2023-04-27 DIAGNOSIS — N184 Chronic kidney disease, stage 4 (severe): Secondary | ICD-10-CM | POA: Diagnosis not present

## 2023-04-27 DIAGNOSIS — I7782 Antineutrophilic cytoplasmic antibody (ANCA) vasculitis: Secondary | ICD-10-CM | POA: Diagnosis not present

## 2023-04-27 DIAGNOSIS — Z79899 Other long term (current) drug therapy: Secondary | ICD-10-CM | POA: Diagnosis not present

## 2023-06-05 ENCOUNTER — Ambulatory Visit
Admission: RE | Admit: 2023-06-05 | Discharge: 2023-06-05 | Disposition: A | Discharge status: Home / Self Care | Source: Ambulatory Visit | Attending: Family Medicine | Admitting: Family Medicine

## 2023-06-05 VITALS — BP 152/87 | HR 72 | Temp 99.0°F | Resp 16 | Ht 67.0 in | Wt 178.0 lb

## 2023-06-05 DIAGNOSIS — J069 Acute upper respiratory infection, unspecified: Secondary | ICD-10-CM | POA: Diagnosis not present

## 2023-06-05 DIAGNOSIS — J309 Allergic rhinitis, unspecified: Secondary | ICD-10-CM | POA: Diagnosis not present

## 2023-06-05 DIAGNOSIS — R059 Cough, unspecified: Secondary | ICD-10-CM

## 2023-06-05 MED ORDER — FEXOFENADINE HCL 180 MG PO TABS: 180.0000 mg | ORAL_TABLET | Freq: Every day | ORAL | 0 refills | Status: DC

## 2023-06-05 MED ORDER — PREDNISONE 20 MG PO TABS: ORAL_TABLET | ORAL | 0 refills | Status: DC

## 2023-06-05 MED ORDER — AZITHROMYCIN 250 MG PO TABS: 250.0000 mg | ORAL_TABLET | Freq: Every day | ORAL | 0 refills | Status: DC

## 2023-06-05 NOTE — ED Triage Notes (Signed)
 Pt states that she has a cough and her throat feels "constricted" x1 week Pt states that she also has some wheezing.

## 2023-06-05 NOTE — ED Provider Notes (Signed)
 [facility]    CSN: [id number] Arrival date & time: 06/05/23  0946      History   Chief Complaint Chief Complaint  Patient presents with   Sore Throat    Not really sore throat but feels more tight. Have some coughing and shortness of breath. I'm wondering if I have an allergic reaction. I spent 11 days with my daughter and she has 3 dogs, a cat and 2 [place] pigs in her house. I have no animals. - Entered by patient    HPI Gloria Villa is a 73 y.o. female.   HPI Pleasant 73 year old female presents with sore throat, coughing and shortness of breath.  Patient reports that she spent the past 11 days with her daughter who has 3 dogs a cat and 2 [place] pigs and believe that she may have allergies.  PMH significant for morbid obesity, HTN, and CKD.  Past Medical History:  Diagnosis Date   ANCA-positive vasculitis (HCC)    MPO antibody   Anemia due to chronic kidney disease    Blood transfusion without reported diagnosis    Chronic kidney disease    Cyst of left breast 10/30/2017   stable cyst with internal septation in the left breast at 10 o'clock 5 cm from the nipple measuring 6 x 2 x 8 mm   History of shingles    age 12's   Hypertension    Mixed hyperlipidemia 04/27/2017   no meds ever- will try to control with diet and exercise   Osteopenia of forearm 04/26/2017   pt unsure of this 06-25-19   Overweight with body mass index (BMI) 25.0-29.9 04/05/2017   Renal osteodystrophy    Calcitriol 0.5 mcg QOD    Patient Active Problem List   Diagnosis Date Noted   Hypertension 03/29/2023   Iron deficiency anemia secondary to inadequate dietary iron intake 06/07/2021   Cyst of left breast 10/30/2017   Mixed hyperlipidemia 04/27/2017   Osteopenia of forearm 04/26/2017   Abnormal mammogram of left breast 04/24/2017   Chronic kidney disease (CKD), active medical management without dialysis, stage 3 (moderate) (HCC) 04/05/2017   Renal hypertension 04/05/2017    ANCA-positive vasculitis (HCC) 04/05/2017   Renal osteodystrophy    Vasculitis (HCC) 06/23/2014    Past Surgical History:  Procedure Laterality Date   ABDOMINAL HYSTERECTOMY     partial, fibroids   COLONOSCOPY     RENAL BIOPSY  06/2014    OB History     Gravida  2   Para  2   Term      Preterm      AB      Living  2      SAB      IAB      Ectopic      Multiple      Live Births               Home Medications    Prior to Admission medications   Medication Sig Start Date End Date Taking? Authorizing Provider  amLODipine (NORVASC) 5 MG tablet Take 1 tablet (5 mg total) by mouth daily. 03/29/23  Yes Christen Butter, NP  atovaquone [facility]) 750 MG/5ML suspension Take by mouth. 12/02/21  Yes [provider]  azithromycin (ZITHROMAX) 250 MG tablet Take 1 tablet (250 mg total) by mouth daily. Take first 2 tablets together, then 1 every day until finished. 06/05/23  Yes Trevor Iha, FNP  carvedilol (COREG) 3.125 MG tablet Take 1 tablet (  3.125 mg total) by mouth 2 (two) times daily with a meal. 03/29/23  Yes Christen Butter, NP  cholecalciferol (VITAMIN D3) 25 MCG (1000 UNIT) tablet Take 1,000 Units by mouth daily.   Yes [provider]  ferrous sulfate 325 (65 FE) MG EC tablet Take 1 tablet by mouth daily. 08/11/21  Yes [provider]  fexofenadine (ALLEGRA ALLERGY) 180 MG tablet Take 1 tablet (180 mg total) by mouth daily for 15 days. 06/05/23 06/20/23 Yes Trevor Iha, FNP  predniSONE (DELTASONE) 20 MG tablet Take 3 tabs PO daily x 5 days. 06/05/23  Yes Trevor Iha, FNP  riTUXimab-abbs (TRUXIMA IV) Inject into the vein.   Yes [provider]  rosuvastatin (CRESTOR) 10 MG tablet Take 1 tablet (10 mg total) by mouth daily. 03/30/23  Yes Christen Butter, NP  sodium bicarbonate 650 MG tablet Take 650 mg by mouth 2 (two) times daily. 10/22/21  Yes [provider]    Family History Family History  Problem Relation Age of Onset   Liver  cancer Mother    Diabetes Mother    Hypertension Mother    Heart attack Father    Diabetes Brother    Hypertension Brother    Stroke Brother    Breast cancer Neg Hx    Colon cancer Neg Hx    Esophageal cancer Neg Hx    Rectal cancer Neg Hx    Stomach cancer Neg Hx     Social History Social History   Tobacco Use   Smoking status: Never   Smokeless tobacco: Never  Vaping Use   Vaping status: Never Used  Substance Use Topics   Alcohol use: Yes    Comment: 1-2 drinks/month, wine   Drug use: No     Allergies   Dapsone and Sulfa antibiotics   Review of Systems Review of Systems  HENT:  Positive for sore throat.   Respiratory:  Positive for cough and shortness of breath.      Physical Exam Triage Vital Signs ED Triage Vitals  Encounter Vitals Group     BP      Systolic BP Percentile      Diastolic BP Percentile      Pulse      Resp      Temp      Temp src      SpO2      Weight      Height      Head Circumference      Peak Flow      Pain Score      Pain Loc      Pain Education      Exclude from Growth Chart    No data found.  Updated Vital Signs BP (!) 152/87 (BP Location: Right Arm)   Pulse 72   Temp 99 F (37.2 C) (Oral)   Resp 16   Ht 5\' 7"  (1.702 m)   Wt 178 lb (80.7 kg)   SpO2 96%   BMI 27.88 kg/m    Physical Exam Vitals and nursing note reviewed.  Constitutional:      General: She is not in acute distress.    Appearance: Normal appearance. She is obese. She is ill-appearing. She is not toxic-appearing or diaphoretic.  HENT:     Head: Normocephalic and atraumatic.     Right Ear: Tympanic membrane, ear canal and external ear normal.     Left Ear: Tympanic membrane, ear canal and external ear normal.  Mouth/Throat:     Mouth: Mucous membranes are moist.     Pharynx: Oropharynx is clear.     Comments: Significant amount of clear drainage of posterior oropharynx noted Eyes:     Extraocular Movements: Extraocular movements intact.      Conjunctiva/sclera: Conjunctivae normal.     Pupils: Pupils are equal, round, and reactive to light.  Cardiovascular:     Rate and Rhythm: Normal rate and regular rhythm.     Pulses: Normal pulses.     Heart sounds: Normal heart sounds.  Pulmonary:     Effort: Pulmonary effort is normal.     Breath sounds: Normal breath sounds. No wheezing, rhonchi or rales.     Comments: Infrequent nonproductive cough on exam Musculoskeletal:        General: Normal range of motion.     Cervical back: Normal range of motion and neck supple.  Skin:    General: Skin is warm and dry.  Neurological:     General: No focal deficit present.     Mental Status: She is alert and oriented to person, place, and time. Mental status is at baseline.  Psychiatric:        Mood and Affect: Mood normal.        Behavior: Behavior normal.      UC Treatments / Results  Labs (all labs ordered are listed, but only abnormal results are displayed) Labs Reviewed - No data to display  EKG   Radiology No results found.  Procedures Procedures (including critical care time)  Medications Ordered in UC Medications - No data to display  Initial Impression / Assessment and Plan / UC Course  I have reviewed the triage vital signs and the nursing notes.  Pertinent labs & imaging results that were available during my care of the patient were reviewed by me and considered in my medical decision making (see chart for details).     MDM: 1.  Rx'd Zithromax 500 mg day 1, then 250 mg day 2-5; 2.  Cough, unspecified type-Rx'd prednisone 20 mg tablet: Take 3 tablets p.o. daily x 5 days; 3.  Allergic rhinitis, unspecified seasonality, unspecified trigger-Rx'd Allegra 180 mg tablet: Take 1 tablet daily for 5 days, then as needed. Advised patient to take medications as directed with food to completion.  Advised to take prednisone and Allegra with Zithromax for the next 5 days.  Advised may use Allegra as needed afterwards for  concurrent postnasal drainage/drip/runny nose.  Encouraged to increase daily water intake to 64 ounces per day while taking these medications.  Advised if symptoms worsen and/or unresolved please follow-up with your PCP or here for further evaluation.  Patient discharged home, hemodynamically stable. Final Clinical Impressions(s) / UC Diagnoses   Final diagnoses:  Acute URI  Cough, unspecified type  Allergic rhinitis, unspecified seasonality, unspecified trigger     Discharge Instructions      Advised patient to take medications as directed with food to completion.  Advised to take prednisone and Allegra with Zithromax for the next 5 days.  Advised may use Allegra as needed afterwards for concurrent postnasal drainage/drip/runny nose.  Encouraged to increase daily water intake to 64 ounces per day while taking these medications.  Advised if symptoms worsen and/or unresolved please follow-up with your PCP or here for further evaluation.     ED Prescriptions     Medication Sig Dispense Auth. Provider   azithromycin (ZITHROMAX) 250 MG tablet Take 1 tablet (250 mg total) by mouth daily.  Take first 2 tablets together, then 1 every day until finished. 6 tablet Dalesha Stanback, FNP   predniSONE (DELTASONE) 20 MG tablet Take 3 tabs PO daily x 5 days. 15 tablet Coulter Oldaker, FNP   fexofenadine (ALLEGRA ALLERGY) 180 MG tablet Take 1 tablet (180 mg total) by mouth daily for 15 days. 15 tablet Shawndell Varas, FNP      PDMP not reviewed this encounter.   Leonides Ramp, FNP 06/05/23 1116

## 2023-06-05 NOTE — Discharge Instructions (Addendum)
 Advised patient to take medications as directed with food to completion.  Advised to take prednisone and Allegra with Zithromax for the next 5 days.  Advised may use Allegra as needed afterwards for concurrent postnasal drainage/drip/runny nose.  Encouraged to increase daily water intake to 64 ounces per day while taking these medications.  Advised if symptoms worsen and/or unresolved please follow-up with your PCP or here for further evaluation.

## 2023-07-02 ENCOUNTER — Encounter: Payer: Self-pay | Admitting: Physician Assistant

## 2023-07-02 ENCOUNTER — Telehealth: Admitting: Physician Assistant

## 2023-07-02 DIAGNOSIS — J069 Acute upper respiratory infection, unspecified: Secondary | ICD-10-CM

## 2023-07-02 NOTE — Progress Notes (Signed)
  Because your recent unsuccessful treatment, I feel your condition warrants further evaluation and I recommend that you be seen in a face-to-face visit.   NOTE: There will be NO CHARGE for this E-Visit   If you are having a true medical emergency, please call 911.     For an urgent face to face visit, [facility] has multiple urgent care centers for your convenience.  Click the link below for the full list of locations and hours, walk-in wait times, appointment scheduling options and driving directions:  Urgent Care - [place], [place], [place], [place], [place], [place]  [place]     Your MyChart E-visit questionnaire answers were reviewed by a board certified advanced clinical practitioner to complete your personal care plan based on your specific symptoms.    Thank you for using e-Visits.    I have spent 5 minutes in review of e-visit questionnaire, review and updating patient chart, medical decision making and response to patient.   Etter Hermann Mayers, PA-C

## 2023-07-03 ENCOUNTER — Encounter: Payer: Self-pay | Admitting: Medical-Surgical

## 2023-07-03 ENCOUNTER — Ambulatory Visit: Admitting: Medical-Surgical

## 2023-07-03 VITALS — BP 142/85 | HR 73 | Resp 20 | Ht 67.0 in | Wt 180.5 lb

## 2023-07-03 DIAGNOSIS — J019 Acute sinusitis, unspecified: Secondary | ICD-10-CM | POA: Diagnosis not present

## 2023-07-03 DIAGNOSIS — B9689 Other specified bacterial agents as the cause of diseases classified elsewhere: Secondary | ICD-10-CM

## 2023-07-03 MED ORDER — FLUCONAZOLE 150 MG PO TABS: 150.0000 mg | ORAL_TABLET | Freq: Once | ORAL | 0 refills | Status: AC

## 2023-07-03 MED ORDER — AMOXICILLIN-POT CLAVULANATE 875-125 MG PO TABS
1.0000 | ORAL_TABLET | Freq: Two times a day (BID) | ORAL | 0 refills | Status: DC
Start: 2023-07-03 — End: 2023-09-25

## 2023-07-03 MED ORDER — HYDROCODONE BIT-HOMATROP MBR 5-1.5 MG/5ML PO SOLN: 5.0000 mL | Freq: Three times a day (TID) | ORAL | 0 refills | Status: DC | PRN

## 2023-07-03 MED ORDER — BENZONATATE 200 MG PO CAPS: 200.0000 mg | ORAL_CAPSULE | Freq: Three times a day (TID) | ORAL | 0 refills | Status: DC | PRN

## 2023-07-03 NOTE — Progress Notes (Signed)
        Established patient visit  History, exam, impression, and plan:  1. Acute bacterial sinusitis (Primary) Pleasant 73 year old female presenting today with reports of 1 month of upper respiratory symptoms including nonproductive cough, postnasal drip, headache behind her eyes, and generalized fatigue.  Her symptoms originally started right before Easter and she was evaluated at an urgent care where they prescribed her azithromycin  and prednisone  for her symptoms.  She was started on an antihistamine has been taking Allegra  180 mg daily.  Notes that she did not have any relief of symptoms although she completed the course of these medications.  She has been trying to tough it out but she is now having difficulty sleeping due to coughing at night.  No fevers or chills.  Denies any GI symptoms.  She does have some cloudy light yellow nasal discharge as well as injection and cobblestoning to the posterior oropharynx.  Lungs clear to auscultation with even unlabored respirations.  Bilateral ears normal.  Endorses some crusting to her eyelashes when she wakes in the mornings.  Plan to treat for bacterial sinusitis with Augmentin  twice daily x 10 days.  Adding Diflucan for VVC prophylaxis.  Tessalon Perles and Hycodan cough syrup for cough management.  Okay to continue Allegra  if desired. - amoxicillin -clavulanate (AUGMENTIN ) 875-125 MG tablet; Take 1 tablet by mouth 2 (two) times daily.  Dispense: 20 tablet; Refill: 0 - fluconazole (DIFLUCAN) 150 MG tablet; Take 1 tablet (150 mg total) by mouth once for 1 dose. Repeat dose 72 hours if yeast infection persists  Dispense: 2 tablet; Refill: 0 - benzonatate (TESSALON) 200 MG capsule; Take 1 capsule (200 mg total) by mouth 3 (three) times daily as needed for cough.  Dispense: 45 capsule; Refill: 0 - HYDROcodone bit-homatropine (HYCODAN) 5-1.5 MG/5ML syrup; Take 5 mLs by mouth every 8 (eight) hours as needed for cough.  Dispense: 120 mL; Refill:  0   Procedures performed this visit: None.  Return if symptoms worsen or fail to improve.  __________________________________ Maryl Snook, DNP, APRN, FNP-BC Primary Care and Sports Medicine [facility]

## 2023-08-22 DIAGNOSIS — I7782 Antineutrophilic cytoplasmic antibody (ANCA) vasculitis: Secondary | ICD-10-CM | POA: Diagnosis not present

## 2023-08-22 DIAGNOSIS — N184 Chronic kidney disease, stage 4 (severe): Secondary | ICD-10-CM | POA: Diagnosis not present

## 2023-09-25 NOTE — Progress Notes (Unsigned)
 Established patient visit   History of Present Illness   Discussed the use of AI scribe software for clinical note transcription with the patient, who gave verbal consent to proceed.  History of Present Illness   Gloria Villa is a 73 year old female with hypertension who presents for a blood pressure follow-up.  She monitors her blood pressure every morning before taking her medications. She takes amlodipine  daily and uses carvedilol  as needed. When taking both medications regularly, her blood pressure can drop to 115/69 mmHg, causing lightheadedness. No chest pain, shortness of breath, palpitations, leg or foot swelling, headaches, dizziness, or syncope.  She continues Crestor  for cholesterol management and notes a slight decrease in kidney function from GFR 32 to 29 in July. She wants to check her cholesterol levels again. She is cautious about using ibuprofen and other medications that might affect her kidneys.  She experiences occasional ear blockage or fluid sensation while watching TV.      Physical Exam   Physical Exam Constitutional:      General: She is not in acute distress.    Appearance: Normal appearance. She is not ill-appearing.  HENT:     Head: Normocephalic and atraumatic.     Right Ear: Tympanic membrane, ear canal and external ear normal. There is no impacted cerumen.     Left Ear: Tympanic membrane, ear canal and external ear normal. There is no impacted cerumen.     Nose: Nose normal. No congestion or rhinorrhea.     Mouth/Throat:     Mouth: Mucous membranes are moist.     Pharynx: No oropharyngeal exudate or posterior oropharyngeal erythema.  Eyes:     General: No scleral icterus.       Right eye: No discharge.        Left eye: No discharge.     Extraocular Movements: Extraocular movements intact.     Conjunctiva/sclera: Conjunctivae normal.     Pupils: Pupils are equal, round, and reactive to light.  Neck:     Thyroid : No thyromegaly.      Vascular: No carotid bruit or JVD.     Trachea: Trachea normal.  Cardiovascular:     Rate and Rhythm: Normal rate and regular rhythm.     Pulses: Normal pulses.     Heart sounds: Normal heart sounds. No murmur heard.    No friction rub. No gallop.  Pulmonary:     Effort: Pulmonary effort is normal. No respiratory distress.     Breath sounds: Normal breath sounds. No wheezing.  Abdominal:     General: Bowel sounds are normal. There is no distension.     Palpations: Abdomen is soft.     Tenderness: There is no abdominal tenderness. There is no guarding.  Musculoskeletal:        General: Normal range of motion.     Cervical back: Normal range of motion and neck supple.  Lymphadenopathy:     Cervical: No cervical adenopathy.  Skin:    General: Skin is warm and dry.  Neurological:     Mental Status: She is alert and oriented to person, place, and time.     Cranial Nerves: No cranial nerve deficit.  Psychiatric:        Mood and Affect: Mood normal.        Behavior: Behavior normal.        Thought Content: Thought content normal.        Judgment: Judgment normal.  Assessment & Plan   Assessment and Plan    Hypertension Managed with amlodipine  and carvedilol . Occasional lightheadedness with both medications. Self-adjusts carvedilol  based on blood pressure. - Continue amlodipine  daily. - Use carvedilol  as needed based on blood pressure.  Chronic kidney disease stage 4 Recent decrease in kidney function from 32 to 29. Discussed nutrition, hydration, and medication impact. - Monitor kidney function. - Advise on hydration and careful medication use. - Folllow up as instructed with Nephrology.  Hyperlipidemia Managed with Crestor . No recent cholesterol assessment. - Order lipid panel. - Continue Crestor .  Middle Ear Effusion Intermittent sensation of blockage or fluid in the ear. Exam shows middle ear effusion, left worse than right. - No signs of infection.  -  Continue Flonase once daily.   General Health Maintenance Due for tetanus booster.  - Administer tetanus booster.  Colonoscopy referral Prefers referral to [facility] for colonoscopy. - Send referral to [facility] for colonoscopy.      Follow up   Return in about 6 months (around 03/28/2024) for HTN/HLD follow up.  __________________________________ Gloria FREDRIK Palin, DNP, APRN, FNP-BC Primary Care and Sports Medicine [facility] [place]

## 2023-09-26 ENCOUNTER — Encounter: Payer: Self-pay | Admitting: Medical-Surgical

## 2023-09-26 ENCOUNTER — Ambulatory Visit: Payer: BC Managed Care – PPO | Admitting: Medical-Surgical

## 2023-09-26 VITALS — BP 152/80 | HR 71 | Resp 20 | Ht 67.0 in | Wt 181.0 lb

## 2023-09-26 DIAGNOSIS — E782 Mixed hyperlipidemia: Secondary | ICD-10-CM | POA: Diagnosis not present

## 2023-09-26 DIAGNOSIS — Z23 Encounter for immunization: Secondary | ICD-10-CM

## 2023-09-26 DIAGNOSIS — I10 Essential (primary) hypertension: Secondary | ICD-10-CM

## 2023-09-26 DIAGNOSIS — H6593 Unspecified nonsuppurative otitis media, bilateral: Secondary | ICD-10-CM | POA: Diagnosis not present

## 2023-09-26 DIAGNOSIS — Z1211 Encounter for screening for malignant neoplasm of colon: Secondary | ICD-10-CM | POA: Diagnosis not present

## 2023-09-27 ENCOUNTER — Ambulatory Visit: Payer: Self-pay | Admitting: Medical-Surgical

## 2023-09-27 LAB — LIPID PANEL
Chol/HDL Ratio: 3.3 ratio (ref 0.0–4.4)
Cholesterol, Total: 212 mg/dL — ABNORMAL HIGH (ref 100–199)
HDL: 64 mg/dL (ref >39)
LDL Chol Calc (NIH): 117 mg/dL — ABNORMAL HIGH (ref 0–99)
Triglycerides: 181 mg/dL — ABNORMAL HIGH (ref 0–149)
VLDL Cholesterol Cal: 31 mg/dL (ref 5–40)

## 2023-10-30 ENCOUNTER — Encounter: Payer: Self-pay | Admitting: Gastroenterology

## 2023-12-01 DIAGNOSIS — I10 Essential (primary) hypertension: Secondary | ICD-10-CM | POA: Diagnosis not present

## 2023-12-01 DIAGNOSIS — H538 Other visual disturbances: Secondary | ICD-10-CM | POA: Diagnosis not present

## 2023-12-01 DIAGNOSIS — R519 Headache, unspecified: Secondary | ICD-10-CM | POA: Diagnosis not present

## 2023-12-07 ENCOUNTER — Ambulatory Visit (AMBULATORY_SURGERY_CENTER): Admitting: *Deleted

## 2023-12-07 ENCOUNTER — Encounter: Payer: Self-pay | Admitting: Medical-Surgical

## 2023-12-07 VITALS — Ht 67.0 in | Wt 175.0 lb

## 2023-12-07 DIAGNOSIS — Z83719 Family history of colon polyps, unspecified: Secondary | ICD-10-CM

## 2023-12-07 MED ORDER — NA SULFATE-K SULFATE-MG SULF 17.5-3.13-1.6 GM/177ML PO SOLN: 1.0000 | Freq: Once | ORAL | 0 refills | Status: AC

## 2023-12-07 NOTE — Progress Notes (Signed)
 Pt's name and DOB verified at the beginning of the pre-visit with 2 identifiers  Pt denies any difficulty with ambulating,sitting, laying down or rolling side to side  Pt has no issues moving head neck or swallowing  No egg or soy allergy known to patient   No issues known to pt with past sedation  No FH of Malignant Hyperthermia  Pt is not on home 02   Pt is not on blood thinners   Pt denies issues with constipation   Pt is not on dialysis  Pt denise any abnormal heart rhythms   Pt denies any upcoming cardiac testing  Patient's chart reviewed by Norleen Schillings CNRA prior to pre-visit and patient appropriate for the [facility].  Pre-visit completed and red dot placed by patient's name on their procedure day (on provider's schedule).    Visit by phone  Pt states weight 175 lb   Pt given  both [facility] main # and MD on call # prior to instructions.  Informed pt to come in at the time discussed and is shown on PV instructions.  Pt instructed to use Singlecare.com or GoodRx for a price reduction on prep  Instructed pt where to find PV instructions in My Ch. Copy of instructions  to be sent in mail and address read back to pt to verify correct on envelope. Instructed pt on all aspects of written instructions including med holds clothing to wear and foods to eat and not eat as well as after procedure legal restrictions and to call MD on call if needed.. Pt states understanding. Instructed pt to review instructions again prior to procedure and call main # given if has any questions or any issues. Pt states they will.

## 2023-12-14 ENCOUNTER — Encounter: Payer: Self-pay | Admitting: Gastroenterology

## 2023-12-21 ENCOUNTER — Encounter: Payer: Self-pay | Admitting: Gastroenterology

## 2023-12-21 ENCOUNTER — Ambulatory Visit (AMBULATORY_SURGERY_CENTER): Admitting: Gastroenterology

## 2023-12-21 VITALS — BP 136/62 | HR 69 | Temp 97.3°F | Resp 17 | Ht 67.0 in | Wt 175.0 lb

## 2023-12-21 DIAGNOSIS — Z1211 Encounter for screening for malignant neoplasm of colon: Secondary | ICD-10-CM

## 2023-12-21 DIAGNOSIS — Z860101 Personal history of adenomatous and serrated colon polyps: Secondary | ICD-10-CM | POA: Diagnosis not present

## 2023-12-21 DIAGNOSIS — K644 Residual hemorrhoidal skin tags: Secondary | ICD-10-CM | POA: Diagnosis not present

## 2023-12-21 DIAGNOSIS — K5730 Diverticulosis of large intestine without perforation or abscess without bleeding: Secondary | ICD-10-CM | POA: Diagnosis not present

## 2023-12-21 DIAGNOSIS — D122 Benign neoplasm of ascending colon: Secondary | ICD-10-CM

## 2023-12-21 DIAGNOSIS — Z860100 Personal history of colon polyps, unspecified: Secondary | ICD-10-CM

## 2023-12-21 DIAGNOSIS — Z83719 Family history of colon polyps, unspecified: Secondary | ICD-10-CM

## 2023-12-21 DIAGNOSIS — D123 Benign neoplasm of transverse colon: Secondary | ICD-10-CM

## 2023-12-21 DIAGNOSIS — K648 Other hemorrhoids: Secondary | ICD-10-CM

## 2023-12-21 MED ORDER — SODIUM CHLORIDE 0.9 % IV SOLN: 500.0000 mL | Freq: Once | INTRAVENOUS | Status: DC

## 2023-12-21 NOTE — Progress Notes (Signed)
 Vss nad trans to pacu

## 2023-12-21 NOTE — Progress Notes (Signed)
 Called to room to assist during endoscopic procedure.  Patient ID and intended procedure confirmed with present staff. Received instructions for my participation in the procedure from the performing physician.

## 2023-12-21 NOTE — Op Note (Signed)
 [facility] Patient Name: Gloria Villa Procedure Date: 12/21/2023 12:03 PM MRN: [id number] Endoscopist: Gustav ALONSO Mcgee , MD, [id number] Age: 73 Referring MD:  Date of Birth: 1950/05/04 Gender: Female Account #: [id number] Procedure:                Colonoscopy Indications:              High risk colon cancer surveillance: Personal                            history of colonic polyps, High risk colon cancer                            surveillance: Personal history of multiple (3 or                            more) adenomas Medicines:                Monitored Anesthesia Care Procedure:                Pre-Anesthesia Assessment:                           - Prior to the procedure, a History and Physical                            was performed, and patient medications and                            allergies were reviewed. The patient's tolerance of                            previous anesthesia was also reviewed. The risks                            and benefits of the procedure and the sedation                            options and risks were discussed with the patient.                            All questions were answered, and informed consent                            was obtained. Prior Anticoagulants: The patient has                            taken no anticoagulant or antiplatelet agents. ASA                            Grade Assessment: II - A patient with mild systemic                            disease. After reviewing the risks and benefits,  the patient was deemed in satisfactory condition to                            undergo the procedure.                           After obtaining informed consent, the colonoscope                            was passed under direct vision. Throughout the                            procedure, the patient's blood pressure, pulse, and                            oxygen saturations were monitored  continuously. The                            Olympus Scope SN [id number] was introduced through the                            anus and advanced to the the cecum, identified by                            appendiceal orifice and ileocecal valve. The                            colonoscopy was performed without difficulty. The                            patient tolerated the procedure well. The quality                            of the bowel preparation was good. The ileocecal                            valve, appendiceal orifice, and rectum were                            photographed. Scope In: 12:10:29 PM Scope Out: 12:25:21 PM Scope Withdrawal Time: 0 hours 11 minutes 17 seconds  Total Procedure Duration: 0 hours 14 minutes 52 seconds  Findings:                 The perianal and digital rectal examinations were                            normal.                           Three sessile polyps were found in the transverse                            colon and ascending colon. The polyps were 3 to 5  mm in size. These polyps were removed with a cold                            snare. Resection and retrieval were complete.                           A few small-mouthed diverticula were found in the                            sigmoid colon.                           Non-bleeding external and internal hemorrhoids were                            found during retroflexion. The hemorrhoids were                            small. Complications:            No immediate complications. Estimated Blood Loss:     Estimated blood loss was minimal. Impression:               - Three 3 to 5 mm polyps in the transverse colon                            and in the ascending colon, removed with a cold                            snare. Resected and retrieved.                           - Diverticulosis in the sigmoid colon.                           - Non-bleeding external and internal  hemorrhoids. Recommendation:           - Patient has a contact number available for                            emergencies. The signs and symptoms of potential                            delayed complications were discussed with the                            patient. Return to normal activities tomorrow.                            Written discharge instructions were provided to the                            patient.                           - Resume previous diet.                           -  Continue present medications.                           - Await pathology results.                           - Repeat colonoscopy in 3 - 5 years for                            surveillance based on pathology results. Gloria Villa V. [name], MD 12/21/2023 12:32:08 PM This report has been signed electronically.

## 2023-12-21 NOTE — Progress Notes (Signed)
 [facility] History and Physical   Primary Care Physician:  Willo Mini, NP   Reason for Procedure:  History of adenomatous colon polyps  Plan:    Surveillance colonoscopy with possible interventions as needed     HPI: Gloria Villa is a very pleasant 73 y.o. female here for surveillance colonoscopy. Denies any nausea, vomiting, abdominal pain, melena or bright red blood per rectum  The risks and benefits as well as alternatives of endoscopic procedure(s) have been discussed and reviewed.  The patient was provided an opportunity to ask questions and all were answered. The patient agreed with the plan and demonstrated an understanding of the instructions.   Past Medical History:  Diagnosis Date   ANCA-positive vasculitis (HCC)    MPO antibody   Anemia due to chronic kidney disease    Blood transfusion without reported diagnosis    Chronic kidney disease    Cyst of left breast 10/30/2017   stable cyst with internal septation in the left breast at 10 o'clock 5 cm from the nipple measuring 6 x 2 x 8 mm   History of shingles    age 57's   Hypertension    Mixed hyperlipidemia 04/27/2017   no meds ever- will try to control with diet and exercise   Osteopenia of forearm 04/26/2017   pt unsure of this 06-25-19   Overweight with body mass index (BMI) 25.0-29.9 04/05/2017   Renal osteodystrophy    Calcitriol 0.5 mcg QOD    Past Surgical History:  Procedure Laterality Date   ABDOMINAL HYSTERECTOMY     partial, fibroids   COLONOSCOPY     RENAL BIOPSY  06/2014    Prior to Admission medications   Medication Sig Start Date End Date Taking? Authorizing Provider  amLODipine  (NORVASC ) 5 MG tablet Take 1 tablet (5 mg total) by mouth daily. 03/29/23  Yes Willo Mini, NP  carvedilol  (COREG ) 3.125 MG tablet Take 1 tablet (3.125 mg total) by mouth 2 (two) times daily with a meal. 03/29/23  Yes Willo Mini, NP  cholecalciferol (VITAMIN D3) 25 MCG (1000 UNIT) tablet Take 1,000 Units by  mouth daily.   Yes [provider]  ferrous sulfate 325 (65 FE) MG EC tablet Take 1 tablet by mouth daily. 08/11/21  Yes [provider]  fluticasone (FLONASE) 50 MCG/ACT nasal spray Place into both nostrils daily.   Yes [provider]  rosuvastatin  (CRESTOR ) 10 MG tablet Take 1 tablet (10 mg total) by mouth daily. 03/30/23  Yes Willo Mini, NP  sodium bicarbonate 650 MG tablet Take 650 mg by mouth 2 (two) times daily. 10/22/21  Yes [provider]    Current Outpatient Medications  Medication Sig Dispense Refill   amLODipine  (NORVASC ) 5 MG tablet Take 1 tablet (5 mg total) by mouth daily. 90 tablet 3   carvedilol  (COREG ) 3.125 MG tablet Take 1 tablet (3.125 mg total) by mouth 2 (two) times daily with a meal. 180 tablet 3   cholecalciferol (VITAMIN D3) 25 MCG (1000 UNIT) tablet Take 1,000 Units by mouth daily.     ferrous sulfate 325 (65 FE) MG EC tablet Take 1 tablet by mouth daily.     fluticasone (FLONASE) 50 MCG/ACT nasal spray Place into both nostrils daily.     rosuvastatin  (CRESTOR ) 10 MG tablet Take 1 tablet (10 mg total) by mouth daily. 90 tablet 3   sodium bicarbonate 650 MG tablet Take 650 mg by mouth 2 (two) times daily.     Current Facility-Administered  Medications  Medication Dose Route Frequency Provider Last Rate Last Admin   0.9 %  sodium chloride  infusion  500 mL Intravenous Once Javonda Suh V, MD        Allergies as of 12/21/2023 - Review Complete 12/21/2023  Allergen Reaction Noted   Dapsone Swelling and Rash 12/07/2021   Sulfa antibiotics Rash 04/05/2017    Family History  Problem Relation Age of Onset   Liver cancer Mother    Diabetes Mother    Hypertension Mother    Colon polyps Father    Heart attack Father    Diabetes Brother    Hypertension Brother    Stroke Brother    Breast cancer Neg Hx    Colon cancer Neg Hx    Esophageal cancer Neg Hx    Rectal cancer Neg Hx    Stomach cancer Neg Hx     Social History    Socioeconomic History   Marital status: Married    Spouse name: Not on file   Number of children: Not on file   Years of education: Not on file   Highest education level: Associate degree: occupational, scientist, product/process development, or vocational program  Occupational History   Occupation: Retired Financial Controller  Tobacco Use   Smoking status: Never   Smokeless tobacco: Never  Vaping Use   Vaping status: Never Used  Substance and Sexual Activity   Alcohol use: Yes    Comment: 1-2 drinks/month, wine   Drug use: No   Sexual activity: Yes    Partners: Male    Birth control/protection: Post-menopausal  Other Topics Concern   Not on file  Social History Narrative   Not on file   Social Drivers of Health   Financial Resource Strain: Low Risk  (09/25/2023)   Overall Financial Resource Strain (CARDIA)    Difficulty of Paying Living Expenses: Not hard at all  Food Insecurity: No Food Insecurity (09/25/2023)   Hunger Vital Sign    Worried About Running Out of Food in the Last Year: Never true    Ran Out of Food in the Last Year: Never true  Transportation Needs: No Transportation Needs (09/25/2023)   PRAPARE - Administrator, Civil Service (Medical): No    Lack of Transportation (Non-Medical): No  Physical Activity: Sufficiently Active (09/25/2023)   Exercise Vital Sign    Days of Exercise per Week: 3 days    Minutes of Exercise per Session: 50 min  Stress: No Stress Concern Present (09/25/2023)   [employer] of Occupational Health - Occupational Stress Questionnaire    Feeling of Stress: Not at all  Social Connections: Socially Integrated (09/25/2023)   Social Connection and Isolation Panel    Frequency of Communication with Friends and Family: Three times a week    Frequency of Social Gatherings with Friends and Family: Three times a week    Attends Religious Services: More than 4 times per year    Active Member of Clubs or Organizations: Yes    Attends Banker  Meetings: More than 4 times per year    Marital Status: Married  Catering Manager Violence: Unknown (10/08/2022)   Received from [facility]   HITS    Physically Hurt: Not on file    Insult or Talk Down To: Not on file    Threaten Physical Harm: Not on file    Scream or Curse: Not on file    Review of Systems:  All other review of systems negative except as mentioned  in the HPI.  Physical Exam: Vital signs in last 24 hours: BP (!) 152/92   Pulse 78   Temp (!) 97.3 F (36.3 C)   Ht 5' 7 (1.702 m)   Wt 175 lb (79.4 kg)   SpO2 97%   BMI 27.41 kg/m  General:   Alert, NAD Lungs:  Clear .   Heart:  Regular rate and rhythm Abdomen:  Soft, nontender and nondistended. Neuro/Psych:  Alert and cooperative. Normal mood and affect. A and O x 3  Reviewed labs, radiology imaging, old records and pertinent past GI work up  Patient is appropriate for planned procedure(s) and anesthesia in an ambulatory setting   K. Veena Petina Muraski , MD [phone]

## 2023-12-21 NOTE — Progress Notes (Signed)
 Pt's states no medical or surgical changes since previsit or office visit.

## 2023-12-21 NOTE — Patient Instructions (Signed)
 Handouts provided on polyps, diverticulosis and hemorrhoids.  Resume previous diet.  Continue present medications.  Await pathology results.  Repeat colonoscopy in 3-5 years for surveillance based on pathology results.   YOU HAD AN ENDOSCOPIC PROCEDURE TODAY AT THE [facility]:   Refer to the procedure report that was given to you for any specific questions about what was found during the examination.  If the procedure report does not answer your questions, please call your gastroenterologist to clarify.  If you requested that your care partner not be given the details of your procedure findings, then the procedure report has been included in a sealed envelope for you to review at your convenience later.  YOU SHOULD EXPECT: Some feelings of bloating in the abdomen. Passage of more gas than usual.  Walking can help get rid of the air that was put into your GI tract during the procedure and reduce the bloating. If you had a lower endoscopy (such as a colonoscopy or flexible sigmoidoscopy) you may notice spotting of blood in your stool or on the toilet paper. If you underwent a bowel prep for your procedure, you may not have a normal bowel movement for a few days.  Please Note:  You might notice some irritation and congestion in your nose or some drainage.  This is from the oxygen used during your procedure.  There is no need for concern and it should clear up in a day or so.  SYMPTOMS TO REPORT IMMEDIATELY:  Following lower endoscopy (colonoscopy or flexible sigmoidoscopy):  Excessive amounts of blood in the stool  Significant tenderness or worsening of abdominal pains  Swelling of the abdomen that is new, acute  Fever of 100F or higher  For urgent or emergent issues, a gastroenterologist can be reached at any hour by calling [phone]. Do not use MyChart messaging for urgent concerns.    DIET:  We do recommend a small meal at first, but then you may proceed to your regular  diet.  Drink plenty of fluids but you should avoid alcoholic beverages for 24 hours.  ACTIVITY:  You should plan to take it easy for the rest of today and you should NOT DRIVE or use heavy machinery until tomorrow (because of the sedation medicines used during the test).    FOLLOW UP: Our staff will call the number listed on your records the next business day following your procedure.  We will call around 7:15- 8:00 am to check on you and address any questions or concerns that you may have regarding the information given to you following your procedure. If we do not reach you, we will leave a message.     If any biopsies were taken you will be contacted by phone or by letter within the next 1-3 weeks.  Please call us  at [phone] if you have not heard about the biopsies in 3 weeks.    SIGNATURES/CONFIDENTIALITY: You and/or your care partner have signed paperwork which will be entered into your electronic medical record.  These signatures attest to the fact that that the information above on your After Visit Summary has been reviewed and is understood.  Full responsibility of the confidentiality of this discharge information lies with you and/or your care-partner.

## 2023-12-22 ENCOUNTER — Telehealth: Payer: Self-pay | Admitting: *Deleted

## 2023-12-22 NOTE — Telephone Encounter (Signed)
 No answer post call. Left a message.

## 2023-12-25 ENCOUNTER — Ambulatory Visit: Payer: Self-pay | Admitting: Gastroenterology

## 2023-12-25 LAB — SURGICAL PATHOLOGY

## 2023-12-28 DIAGNOSIS — N281 Cyst of kidney, acquired: Secondary | ICD-10-CM | POA: Diagnosis not present

## 2023-12-28 DIAGNOSIS — Z79899 Other long term (current) drug therapy: Secondary | ICD-10-CM | POA: Diagnosis not present

## 2023-12-28 DIAGNOSIS — N184 Chronic kidney disease, stage 4 (severe): Secondary | ICD-10-CM | POA: Diagnosis not present

## 2023-12-28 DIAGNOSIS — I7782 Antineutrophilic cytoplasmic antibody (ANCA) vasculitis: Secondary | ICD-10-CM | POA: Diagnosis not present

## 2024-01-01 ENCOUNTER — Other Ambulatory Visit (HOSPITAL_BASED_OUTPATIENT_CLINIC_OR_DEPARTMENT_OTHER): Payer: Self-pay | Admitting: Medical-Surgical

## 2024-01-01 DIAGNOSIS — Z1231 Encounter for screening mammogram for malignant neoplasm of breast: Secondary | ICD-10-CM

## 2024-01-10 DIAGNOSIS — N281 Cyst of kidney, acquired: Secondary | ICD-10-CM | POA: Diagnosis not present

## 2024-01-10 DIAGNOSIS — N184 Chronic kidney disease, stage 4 (severe): Secondary | ICD-10-CM | POA: Diagnosis not present

## 2024-01-10 DIAGNOSIS — Z79899 Other long term (current) drug therapy: Secondary | ICD-10-CM | POA: Diagnosis not present

## 2024-01-10 DIAGNOSIS — I7782 Antineutrophilic cytoplasmic antibody (ANCA) vasculitis: Secondary | ICD-10-CM | POA: Diagnosis not present

## 2024-01-10 DIAGNOSIS — N1330 Unspecified hydronephrosis: Secondary | ICD-10-CM | POA: Diagnosis not present

## 2024-01-15 DIAGNOSIS — K8020 Calculus of gallbladder without cholecystitis without obstruction: Secondary | ICD-10-CM | POA: Diagnosis not present

## 2024-01-15 DIAGNOSIS — N1330 Unspecified hydronephrosis: Secondary | ICD-10-CM | POA: Diagnosis not present

## 2024-01-15 DIAGNOSIS — K7689 Other specified diseases of liver: Secondary | ICD-10-CM | POA: Diagnosis not present

## 2024-01-15 DIAGNOSIS — K5730 Diverticulosis of large intestine without perforation or abscess without bleeding: Secondary | ICD-10-CM | POA: Diagnosis not present

## 2024-01-29 ENCOUNTER — Encounter (HOSPITAL_BASED_OUTPATIENT_CLINIC_OR_DEPARTMENT_OTHER): Payer: Self-pay

## 2024-01-29 ENCOUNTER — Inpatient Hospital Stay (HOSPITAL_BASED_OUTPATIENT_CLINIC_OR_DEPARTMENT_OTHER)
Admission: RE | Admit: 2024-01-29 | Discharge: 2024-01-29 | Disposition: A | Source: Ambulatory Visit | Attending: Medical-Surgical

## 2024-01-29 DIAGNOSIS — Z1231 Encounter for screening mammogram for malignant neoplasm of breast: Secondary | ICD-10-CM | POA: Diagnosis not present

## 2024-02-04 ENCOUNTER — Ambulatory Visit: Payer: Self-pay | Admitting: Medical-Surgical

## 2024-03-21 ENCOUNTER — Other Ambulatory Visit: Payer: Self-pay | Admitting: Medical-Surgical

## 2024-03-27 ENCOUNTER — Encounter: Payer: Self-pay | Admitting: Medical-Surgical

## 2024-03-28 ENCOUNTER — Ambulatory Visit: Admitting: Medical-Surgical

## 2024-03-28 ENCOUNTER — Encounter: Payer: Self-pay | Admitting: Medical-Surgical

## 2024-03-28 VITALS — BP 121/72 | HR 76 | Ht 67.0 in | Wt 182.0 lb

## 2024-03-28 DIAGNOSIS — E782 Mixed hyperlipidemia: Secondary | ICD-10-CM | POA: Diagnosis not present

## 2024-03-28 DIAGNOSIS — K8020 Calculus of gallbladder without cholecystitis without obstruction: Secondary | ICD-10-CM | POA: Diagnosis not present

## 2024-03-28 DIAGNOSIS — I129 Hypertensive chronic kidney disease with stage 1 through stage 4 chronic kidney disease, or unspecified chronic kidney disease: Secondary | ICD-10-CM

## 2024-03-28 MED ORDER — ROSUVASTATIN CALCIUM 10 MG PO TABS: 10.0000 mg | ORAL_TABLET | Freq: Every day | ORAL | 3 refills | Status: AC

## 2024-03-28 MED ORDER — AMLODIPINE BESYLATE 5 MG PO TABS: 5.0000 mg | ORAL_TABLET | Freq: Every day | ORAL | 3 refills | Status: AC

## 2024-03-28 MED ORDER — CARVEDILOL 3.125 MG PO TABS: 3.1250 mg | ORAL_TABLET | Freq: Two times a day (BID) | ORAL | 3 refills | Status: AC

## 2024-03-28 NOTE — Progress Notes (Signed)
" ° °       Established patient visit   History of Present Illness   Discussed the use of AI scribe software for clinical note transcription with the patient, who gave verbal consent to proceed.  History of Present Illness   Gloria Villa is a 74 year old female with hypertension and hyperlipidemia who presents for a follow-up visit.  Hypertension and hyperlipidemia - Home blood pressure readings are consistent with clinic measurements. - Current antihypertensive regimen includes amlodipine  5 mg daily and carvedilol  (Coreg ) 3.125 mg twice daily. - Takes rosuvastatin  10 mg daily for hyperlipidemia.  Abdominal discomfort and gallbladder symptoms - Abdominal discomfort associated with ingestion of certain foods, particularly popcorn, greasy, and fatty foods such as bacon. - Symptoms are suggestive of gallbladder involvement and have occurred previously with similar dietary triggers. - Recent CT scan performed after ultrasound for renal shadow revealed gallstones and a 12 mm cyst on the hepatic dome.  Physical Exam   Physical Exam Vitals reviewed.  Constitutional:      General: She is not in acute distress.    Appearance: Normal appearance. She is not ill-appearing.  HENT:     Head: Normocephalic and atraumatic.  Cardiovascular:     Rate and Rhythm: Normal rate and regular rhythm.     Pulses: Normal pulses.     Heart sounds: Normal heart sounds. No murmur heard.    No friction rub. No gallop.  Pulmonary:     Effort: Pulmonary effort is normal. No respiratory distress.     Breath sounds: Normal breath sounds. No wheezing.  Skin:    General: Skin is warm and dry.  Neurological:     Mental Status: She is alert and oriented to person, place, and time.  Psychiatric:        Mood and Affect: Mood normal.        Behavior: Behavior normal.        Thought Content: Thought content normal.        Judgment: Judgment normal.    Assessment & Plan     Renal hypertension Blood  pressure well-controlled with current medication. No concerning symptoms reported. - Continue amlodipine  5 mg daily. - Continue Coreg  3.125 mg twice daily. - Monitor blood pressure at home regularly.  Mixed hyperlipidemia Cholesterol levels managed with rosuvastatin .  Lipid panel up-to-date. - Continue rosuvastatin  10 mg daily as prescribed.  Gallstones Identified on CT scan. No symptoms warranting surgery. Discussed dietary modifications and elective nature of surgery unless complications arise. - Added gallstones to problem list for future reference. - Advised dietary modifications to avoid greasy and fatty foods.     Follow up   Return in about 6 months (around 09/25/2024) for HTN/HLD follow up. __________________________________ Zada FREDRIK Palin, DNP, APRN, FNP-BC Primary Care and Sports Medicine [facility] [place] "

## 2024-09-25 ENCOUNTER — Ambulatory Visit: Admitting: Medical-Surgical
